# Patient Record
Sex: Female | Born: 1970 | Race: White | Hispanic: No | Marital: Married | Smoking: Never smoker
Health system: Southern US, Community
[De-identification: ages and names within clinical notes are randomized; demographics above are authoritative.]

## PROBLEM LIST (undated history)

## (undated) DIAGNOSIS — F32A Depression, unspecified: Secondary | ICD-10-CM

## (undated) DIAGNOSIS — R569 Unspecified convulsions: Secondary | ICD-10-CM

## (undated) HISTORY — PX: TUBAL LIGATION: SHX77

## (undated) HISTORY — DX: Depression, unspecified: F32.A

## (undated) HISTORY — PX: CHOLECYSTECTOMY: SHX55

## (undated) HISTORY — PX: EYE SURGERY: SHX253

## (undated) HISTORY — PX: ABDOMINAL HYSTERECTOMY: SHX81

## (undated) HISTORY — DX: Unspecified convulsions: R56.9

## (undated) HISTORY — PX: APPENDECTOMY: SHX54

---

## 1998-08-12 ENCOUNTER — Encounter: Payer: Self-pay | Admitting: Emergency Medicine

## 1998-08-12 ENCOUNTER — Emergency Department (HOSPITAL_COMMUNITY): Admission: EM | Admit: 1998-08-12 | Discharge: 1998-08-12 | Payer: Self-pay | Admitting: Emergency Medicine

## 1999-01-04 ENCOUNTER — Other Ambulatory Visit: Admission: RE | Admit: 1999-01-04 | Discharge: 1999-01-04 | Payer: Self-pay | Admitting: Obstetrics & Gynecology

## 1999-04-03 ENCOUNTER — Other Ambulatory Visit: Admission: RE | Admit: 1999-04-03 | Discharge: 1999-04-03 | Payer: Self-pay | Admitting: Obstetrics & Gynecology

## 1999-06-30 ENCOUNTER — Ambulatory Visit (HOSPITAL_COMMUNITY): Admission: RE | Admit: 1999-06-30 | Discharge: 1999-06-30 | Payer: Self-pay

## 1999-07-27 ENCOUNTER — Inpatient Hospital Stay (HOSPITAL_COMMUNITY): Admission: AD | Admit: 1999-07-27 | Discharge: 1999-07-27 | Payer: Self-pay | Admitting: Obstetrics & Gynecology

## 1999-07-28 ENCOUNTER — Encounter (INDEPENDENT_AMBULATORY_CARE_PROVIDER_SITE_OTHER): Payer: Self-pay | Admitting: Specialist

## 1999-07-28 ENCOUNTER — Inpatient Hospital Stay (HOSPITAL_COMMUNITY): Admission: AD | Admit: 1999-07-28 | Discharge: 1999-07-30 | Payer: Self-pay | Admitting: Obstetrics and Gynecology

## 1999-08-30 ENCOUNTER — Other Ambulatory Visit: Admission: RE | Admit: 1999-08-30 | Discharge: 1999-08-30 | Payer: Self-pay | Admitting: Obstetrics & Gynecology

## 2000-12-08 ENCOUNTER — Inpatient Hospital Stay (HOSPITAL_COMMUNITY): Admission: EM | Admit: 2000-12-08 | Discharge: 2000-12-09 | Payer: Self-pay | Admitting: Emergency Medicine

## 2000-12-08 ENCOUNTER — Encounter: Payer: Self-pay | Admitting: Emergency Medicine

## 2000-12-08 ENCOUNTER — Encounter (INDEPENDENT_AMBULATORY_CARE_PROVIDER_SITE_OTHER): Payer: Self-pay | Admitting: Specialist

## 2001-03-20 ENCOUNTER — Other Ambulatory Visit: Admission: RE | Admit: 2001-03-20 | Discharge: 2001-03-20 | Payer: Self-pay | Admitting: Obstetrics & Gynecology

## 2001-04-11 ENCOUNTER — Ambulatory Visit (HOSPITAL_COMMUNITY): Admission: RE | Admit: 2001-04-11 | Discharge: 2001-04-11 | Payer: Self-pay | Admitting: Obstetrics & Gynecology

## 2002-09-10 ENCOUNTER — Encounter: Admission: RE | Admit: 2002-09-10 | Discharge: 2002-09-10 | Payer: Self-pay | Admitting: Family Medicine

## 2002-09-10 ENCOUNTER — Encounter: Payer: Self-pay | Admitting: Family Medicine

## 2003-05-17 ENCOUNTER — Other Ambulatory Visit: Admission: RE | Admit: 2003-05-17 | Discharge: 2003-05-17 | Payer: Self-pay | Admitting: Obstetrics & Gynecology

## 2004-11-15 ENCOUNTER — Other Ambulatory Visit: Admission: RE | Admit: 2004-11-15 | Discharge: 2004-11-15 | Payer: Self-pay | Admitting: Obstetrics & Gynecology

## 2005-05-07 ENCOUNTER — Other Ambulatory Visit: Admission: RE | Admit: 2005-05-07 | Discharge: 2005-05-07 | Payer: Self-pay | Admitting: Obstetrics & Gynecology

## 2006-02-23 ENCOUNTER — Encounter: Admission: RE | Admit: 2006-02-23 | Discharge: 2006-02-23 | Payer: Self-pay | Admitting: Family Medicine

## 2006-04-16 ENCOUNTER — Emergency Department (HOSPITAL_COMMUNITY): Admission: EM | Admit: 2006-04-16 | Discharge: 2006-04-17 | Payer: Self-pay | Admitting: Emergency Medicine

## 2006-04-26 ENCOUNTER — Ambulatory Visit: Payer: Self-pay | Admitting: Gastroenterology

## 2006-04-29 ENCOUNTER — Ambulatory Visit (HOSPITAL_COMMUNITY): Admission: RE | Admit: 2006-04-29 | Discharge: 2006-04-29 | Payer: Self-pay | Admitting: Gastroenterology

## 2006-05-01 ENCOUNTER — Ambulatory Visit: Payer: Self-pay | Admitting: Gastroenterology

## 2006-05-01 ENCOUNTER — Ambulatory Visit (HOSPITAL_COMMUNITY): Admission: RE | Admit: 2006-05-01 | Discharge: 2006-05-01 | Payer: Self-pay | Admitting: Gastroenterology

## 2006-05-06 ENCOUNTER — Ambulatory Visit: Payer: Self-pay | Admitting: Gastroenterology

## 2006-05-10 ENCOUNTER — Ambulatory Visit (HOSPITAL_COMMUNITY): Admission: RE | Admit: 2006-05-10 | Discharge: 2006-05-10 | Payer: Self-pay | Admitting: Gastroenterology

## 2006-05-28 ENCOUNTER — Ambulatory Visit (HOSPITAL_COMMUNITY): Admission: RE | Admit: 2006-05-28 | Discharge: 2006-05-28 | Payer: Self-pay | Admitting: Gastroenterology

## 2006-07-15 ENCOUNTER — Encounter (INDEPENDENT_AMBULATORY_CARE_PROVIDER_SITE_OTHER): Payer: Self-pay | Admitting: Specialist

## 2006-07-15 ENCOUNTER — Ambulatory Visit (HOSPITAL_COMMUNITY): Admission: RE | Admit: 2006-07-15 | Discharge: 2006-07-16 | Payer: Self-pay | Admitting: Surgery

## 2008-04-20 ENCOUNTER — Encounter: Admission: RE | Admit: 2008-04-20 | Discharge: 2008-04-20 | Payer: Self-pay | Admitting: Family Medicine

## 2008-07-27 IMAGING — CT CT PELVIS W/ CM
2 of 5 series · 17 of 46 positions shown, 19 images · IV contrast (omnipaque)
Comparison: Report of a prior study in November 2001.  The actual images have been purged and are no longer available for comparison.

CLINICAL DATA: Right upper quadrant pain with nausea and vomiting.  
 ABDOMEN CT WITH CONTRAST:
TECHNIQUE: Multidetector CT imaging of the abdomen was performed following the standard protocol during bolus administration of intravenous contrast.
 Contrast:  100 cc Omnipaque 300 and oral contrast.
TECHNIQUE: Multidetector CT imaging of the pelvis was performed following the standard protocol during bolus administration of intravenous contrast.

[Series 2: abd/pelv with 5.0 b31f st · axial · 0.68mm/px · z∈[-468,-63]mm · 14 of 91 slices shown, 16 images]
[im 5/91  soft-tissue]
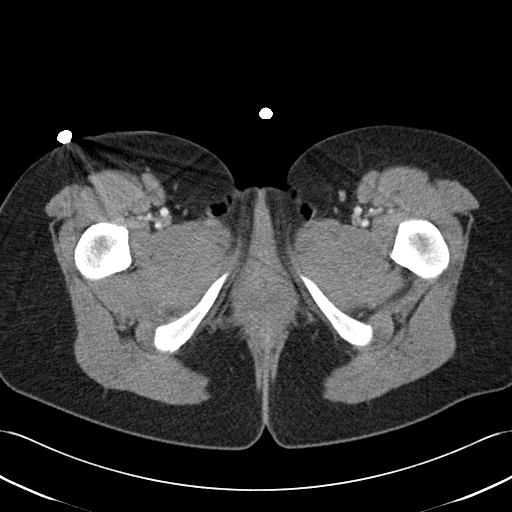
[im 5/91  bone]
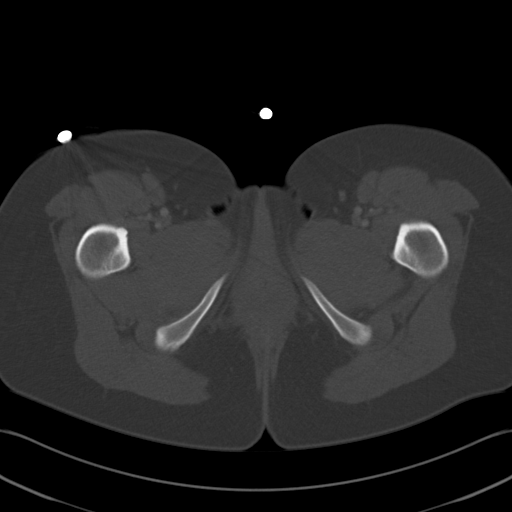
[im 14/91  soft-tissue]
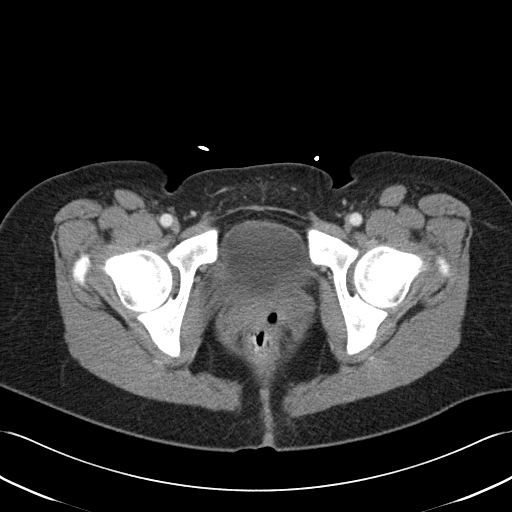
[im 19/91  soft-tissue]
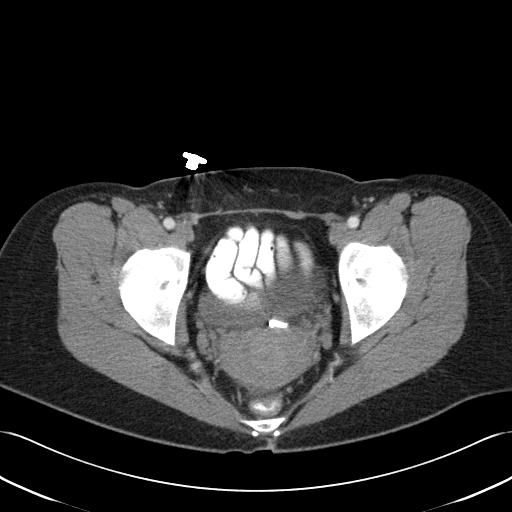
[im 23/91  soft-tissue]
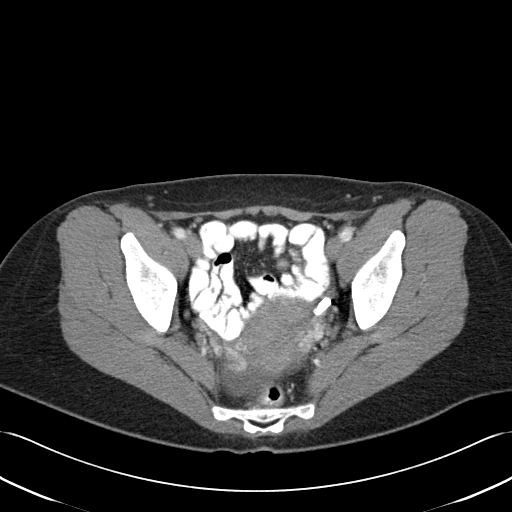
[im 32/91  soft-tissue]
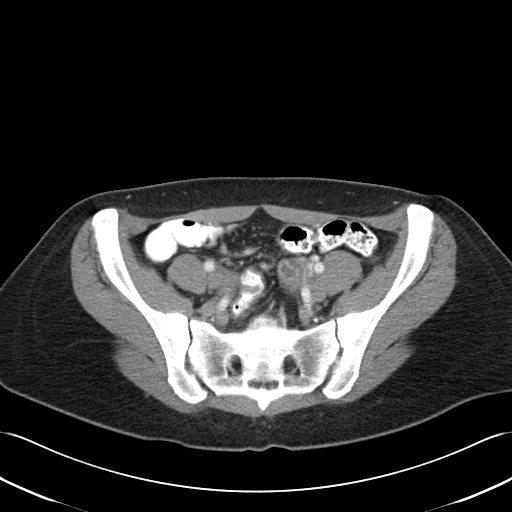
[im 37/91  soft-tissue]
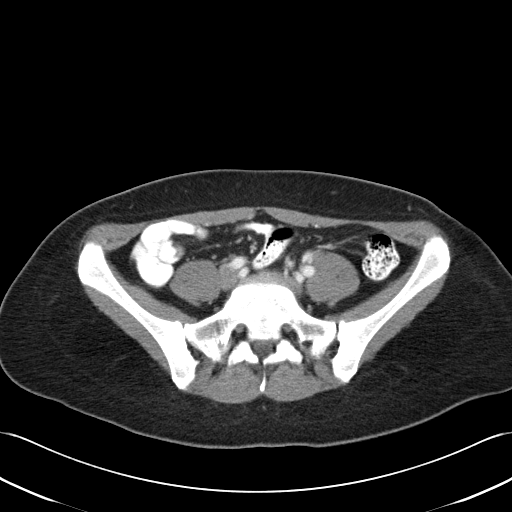
[im 41/91  soft-tissue]
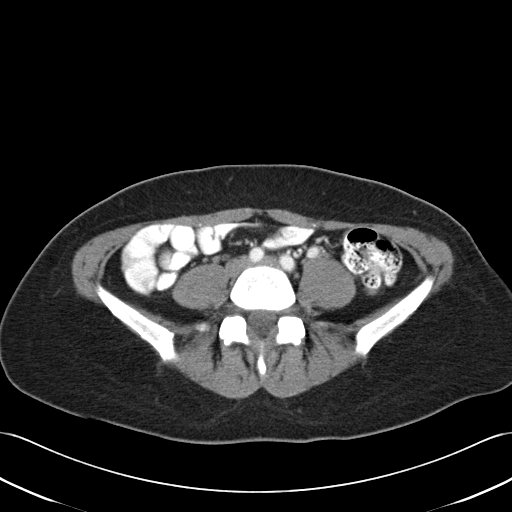
[im 50/91  soft-tissue]
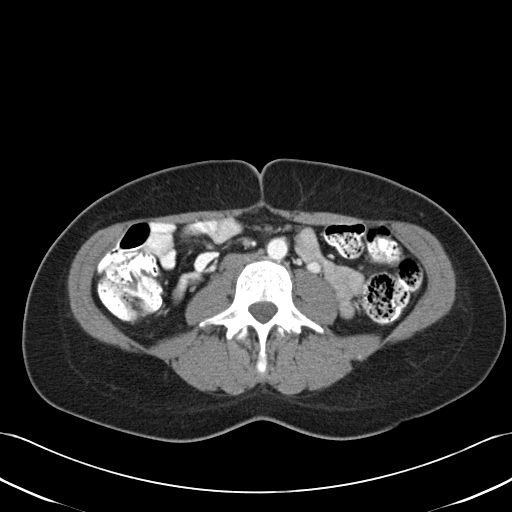
[im 55/91  soft-tissue]
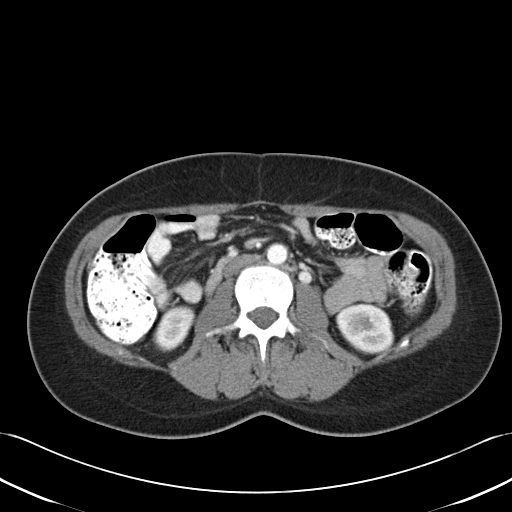
[im 55/91  bone]
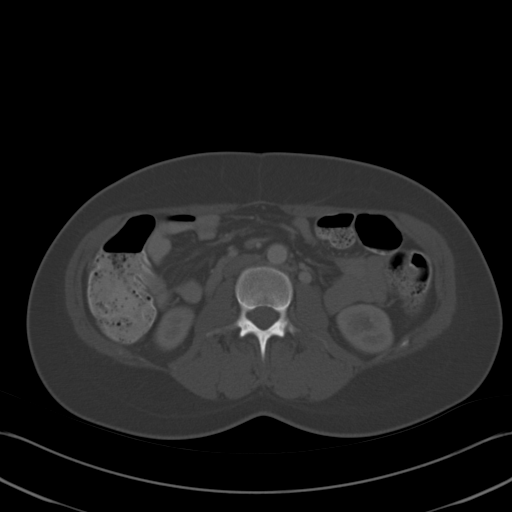
[im 59/91  soft-tissue]
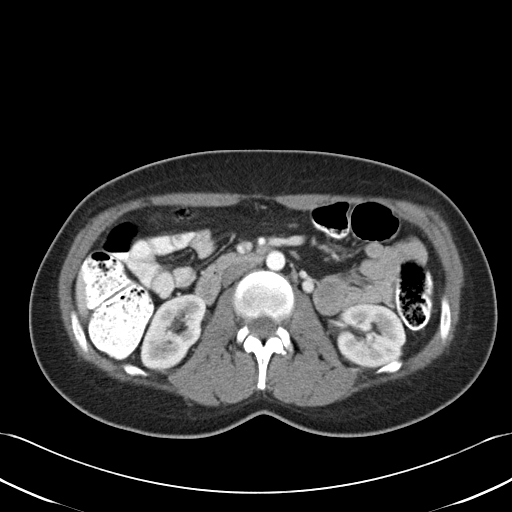
[im 68/91  soft-tissue]
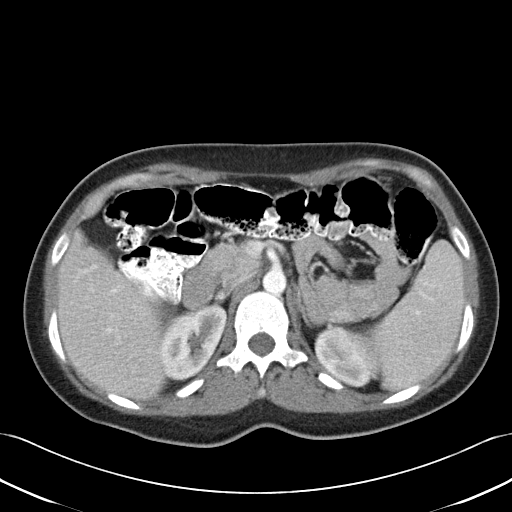
[im 73/91  soft-tissue]
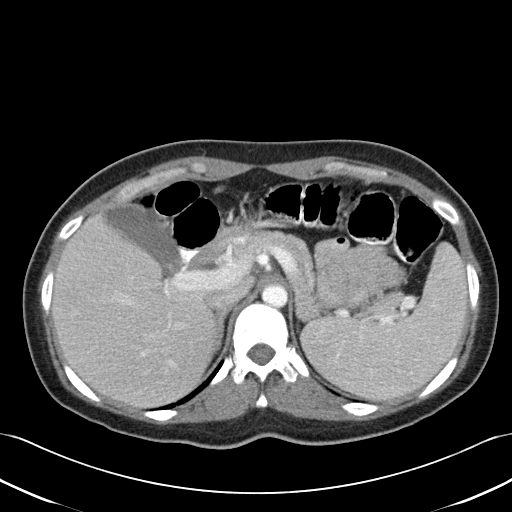
[im 77/91  soft-tissue]
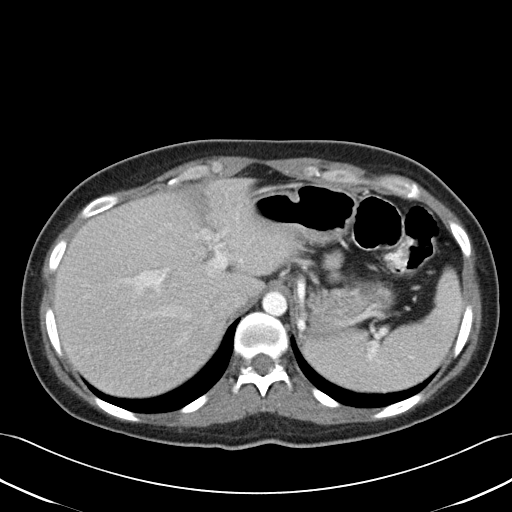
[im 86/91  soft-tissue]
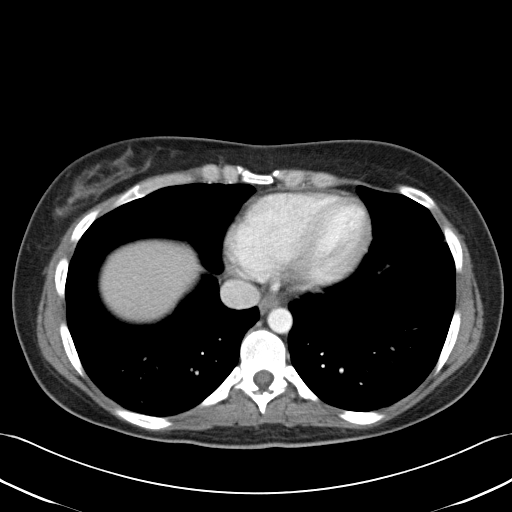

[Series 603: coronals · coronal · 0.89mm/px · 3 of 97 slices shown]
[im 33/97  soft-tissue]
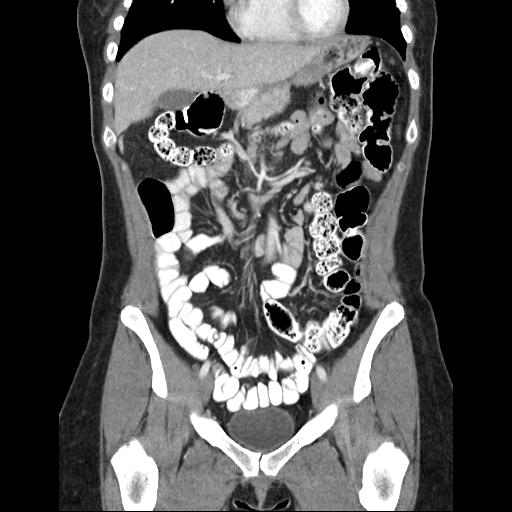
[im 43/97  soft-tissue]
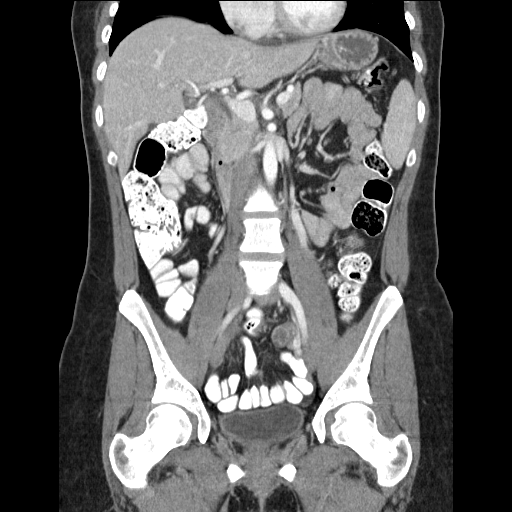
[im 54/97  soft-tissue]
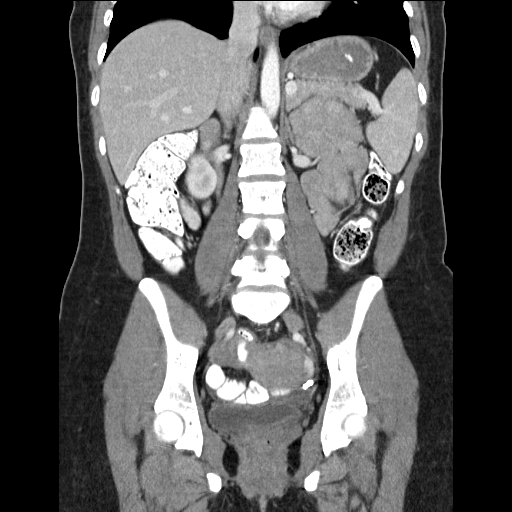

[17 of 46 positions shown; findings below may reference images not displayed]

FINDINGS: Lung bases are clear.  Liver, spleen, pancreas, and adrenal glands are normal.  No calcified gallstones or bile duct dilatation.  Early and delayed images of the kidneys show no masses, inflammatory changes, or obstruction.  Visualized bowel is normal.
IMPRESSION: No pathological findings.  
 PELVIS CT WITH CONTRAST:
FINDINGS: No focal masses, adenopathy, or fluid collections.  Note is made of prominent gonadal veins bilaterally, especially on the left, along with prominent venous structures around each ovary.  The findings are analogous to varicocele in a male.  This can be a cause of chronic pelvic pain, but not classically right upper quadrant pain.  This needs careful clinical correlation.  
 There is a small amount of fluid in the central and right cul-de-sac on images 69 thru 71.  
 No evidence for inflammatory bowel disease.  The appendix is not visualized, consistent with a history of appendectomy.  Also noted are metallic surgical clips in the pelvis centrally and on the left, most likely related to prior tubal ligation.
IMPRESSION: 1.  No acute abnormality.
 2.  Findings consistent with pelvic venous congestion.  See report.

## 2008-10-10 IMAGING — RF DG CHOLANGIOGRAM OPERATIVE
1 series · 4 of 4 positions shown · non-contrast
Comparison: none

CLINICAL DATA: Cholecystectomy for biliary dyskinesia.  
 INTRAOPERATIVE CHOLANGIOGRAM:

[Series 1: run · 4 of 106 frames shown]
[frame 16/106]
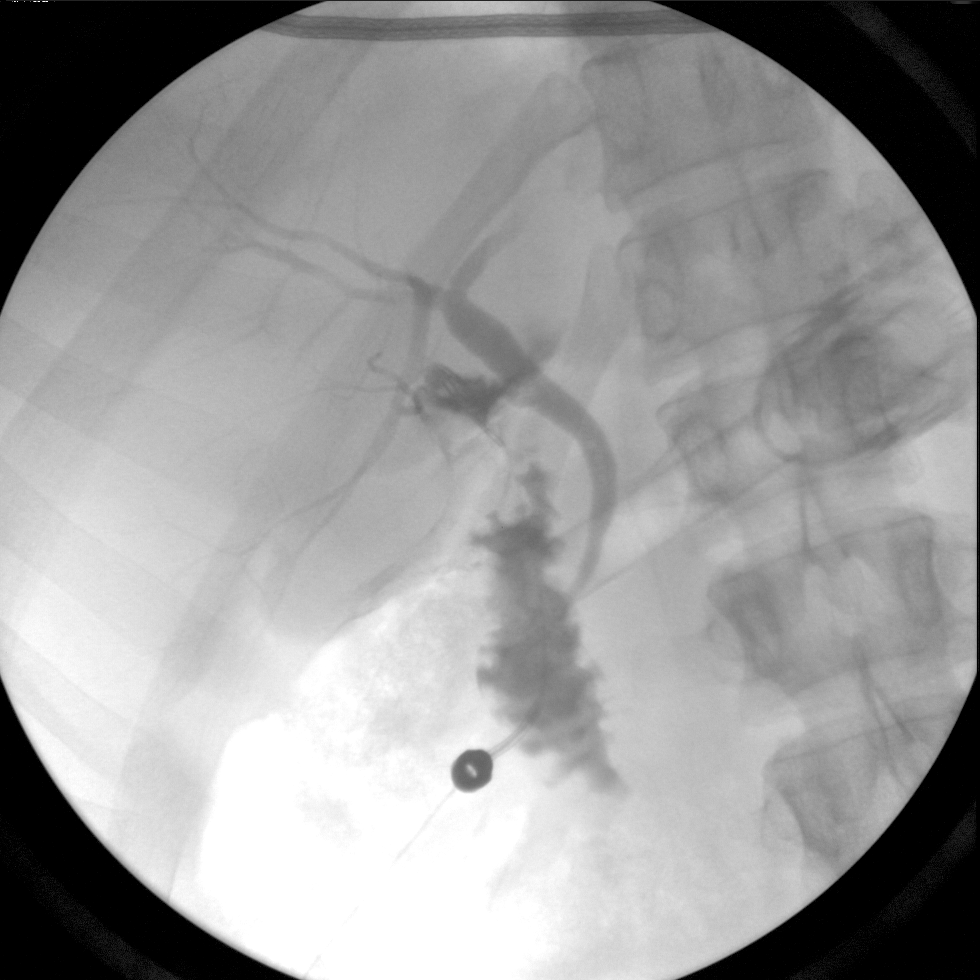
[frame 54/106]
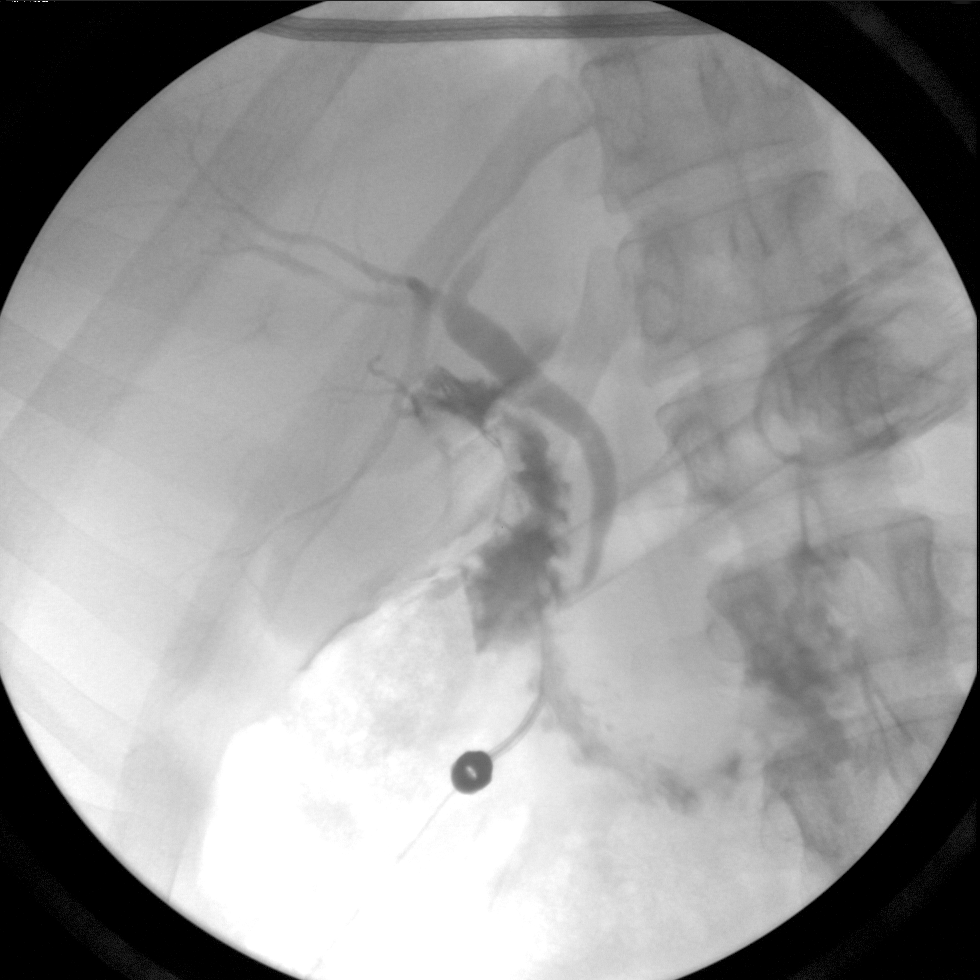
[frame 91/106]
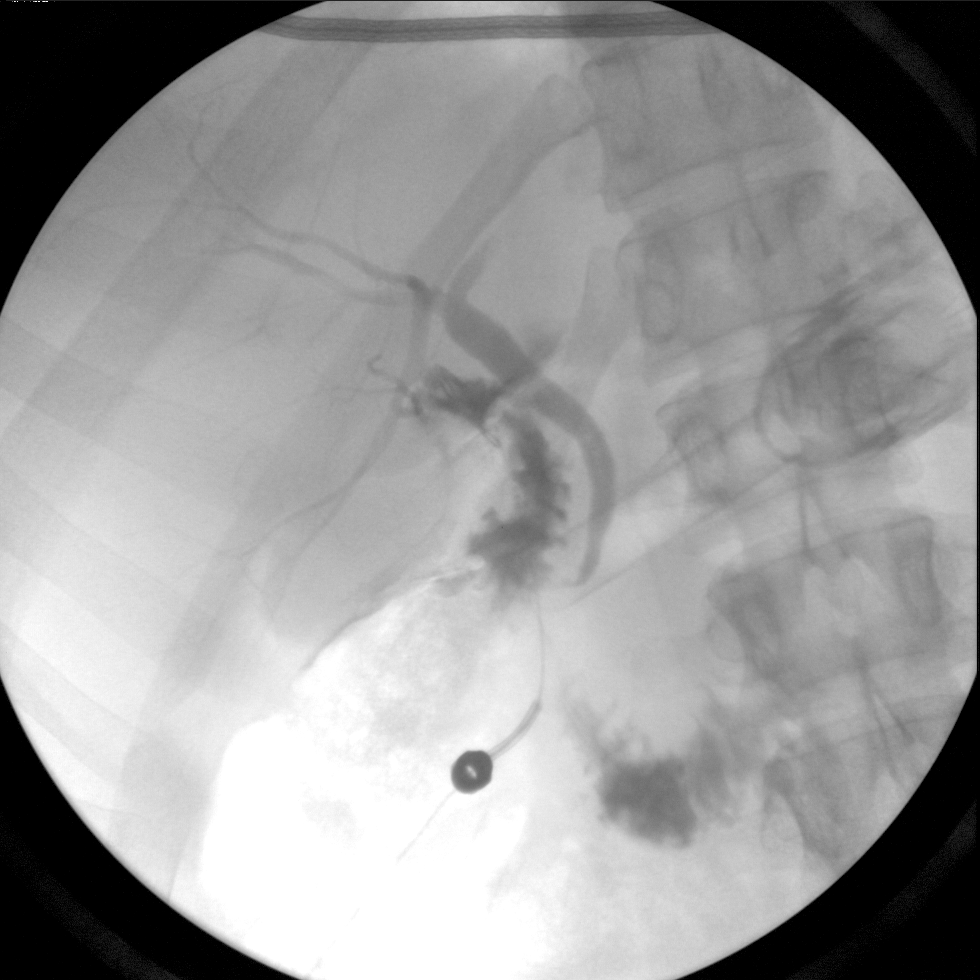
[frame 106/106]
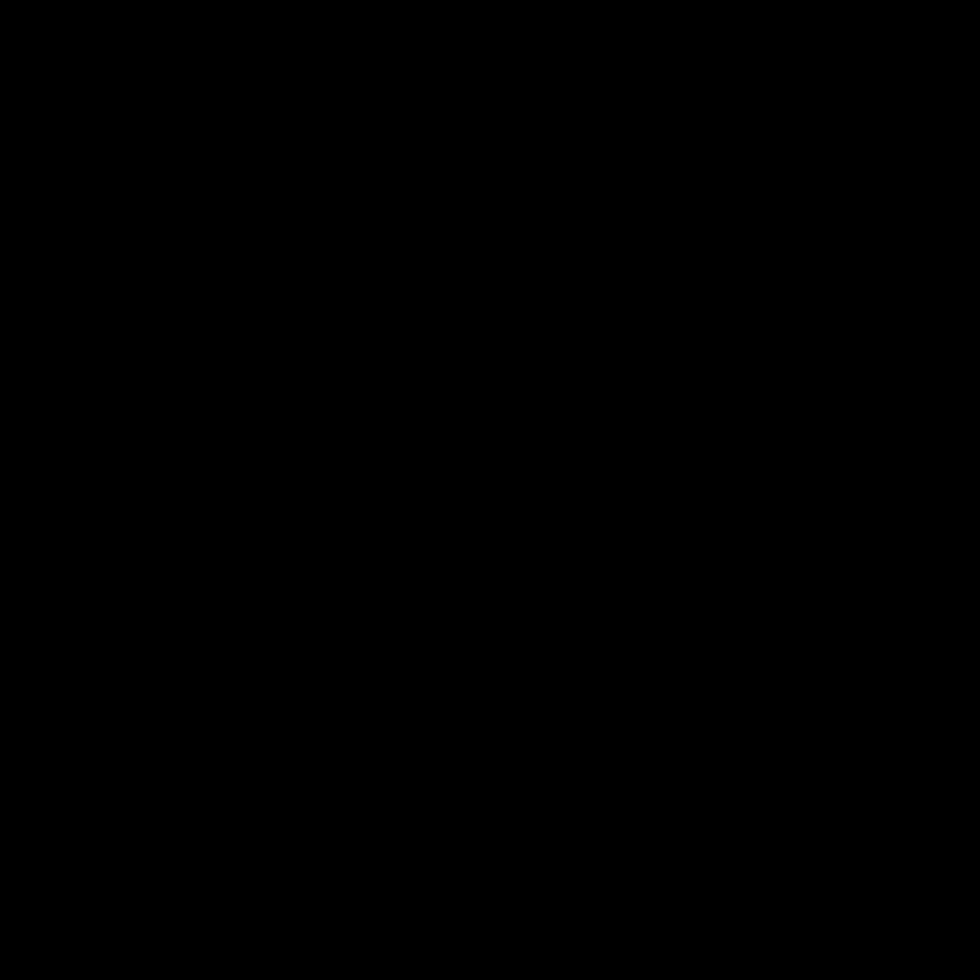

[4 of 4 positions shown; findings below may reference images not displayed]

FINDINGS: Imaging obtained with a C-arm intraoperatively shows injection of contrast material into the biliary tree via a ligated cystic duct.  Some air bubbles are introduced into the duct during injection and clearly transit through the common bile duct into the duodenum.  No evidence of abnormal filling defect or biliary dilatation.
IMPRESSION: Unremarkable intraoperative cholangiogram.

## 2009-08-20 HISTORY — PX: LAPAROSCOPIC TOTAL HYSTERECTOMY: SUR800

## 2009-10-31 ENCOUNTER — Emergency Department (HOSPITAL_COMMUNITY): Admission: EM | Admit: 2009-10-31 | Discharge: 2009-10-31 | Payer: Self-pay | Admitting: Emergency Medicine

## 2010-06-20 ENCOUNTER — Encounter (HOSPITAL_COMMUNITY): Payer: Self-pay | Admitting: Obstetrics and Gynecology

## 2010-06-20 ENCOUNTER — Ambulatory Visit (HOSPITAL_COMMUNITY): Admission: RE | Admit: 2010-06-20 | Discharge: 2010-06-21 | Payer: Self-pay | Admitting: Obstetrics and Gynecology

## 2010-07-17 IMAGING — US US PELVIS COMPLETE
1 series · 14 of 25 positions shown · non-contrast
Comparison: none

CLINICAL DATA: Epigastric and upper back pain.  History pelvic
venous congestion on CT.

TRANSABDOMINAL AND TRANSVAGINAL ULTRASOUND OF PELVIS
TECHNIQUE: Both transabdominal and transvaginal ultrasound
examinations of the pelvis were performed including evaluation of
the uterus, ovaries, adnexal regions, and pelvic cul-de-sac.
[REDACTED] abdominal ultrasound 04/20/2008 and [REDACTED] pelvic ultrasound
04/16/2006.

[Series 1: us pelvis complete · 0.24mm/px · 14 of 65 slices shown]
[im 1/65]
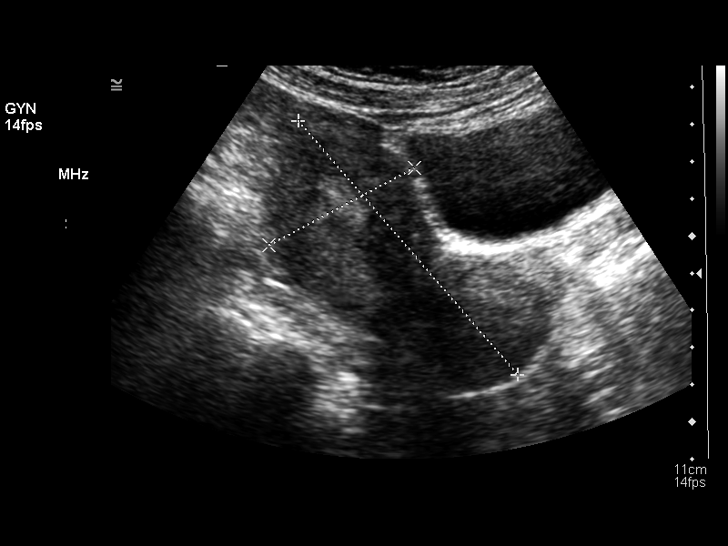
[im 6/65]
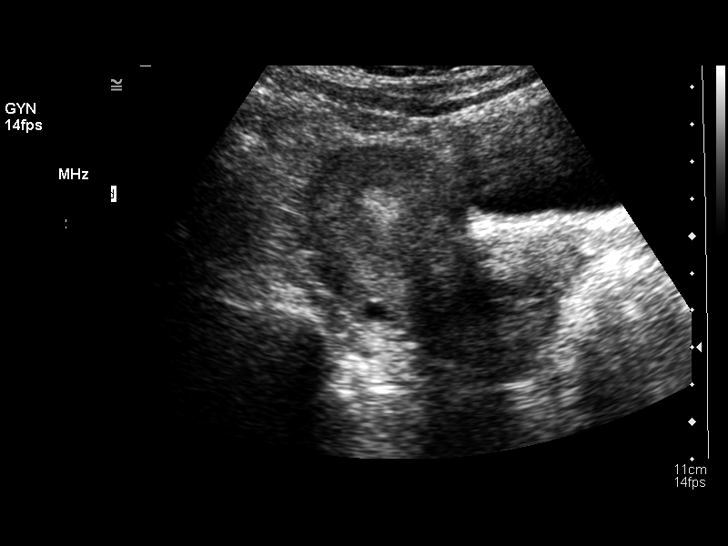
[im 11/65]
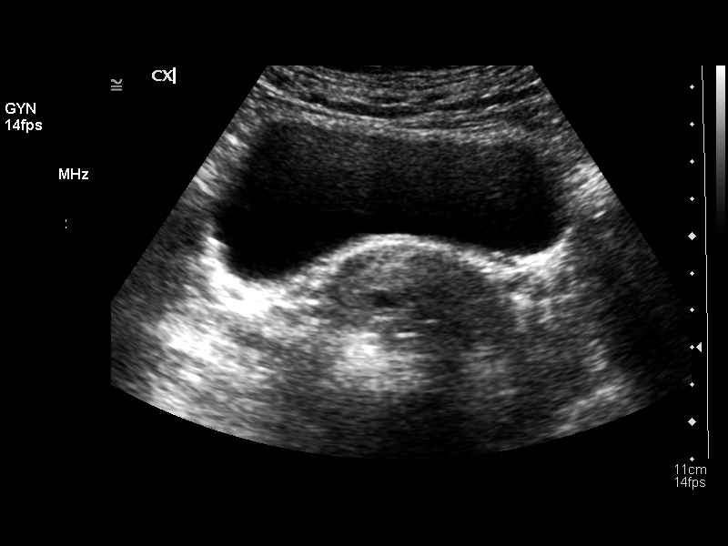
[im 17/65]
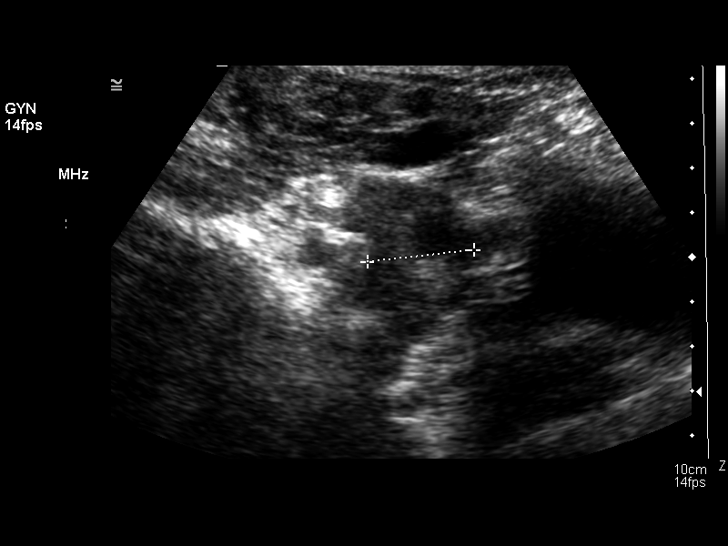
[im 22/65]
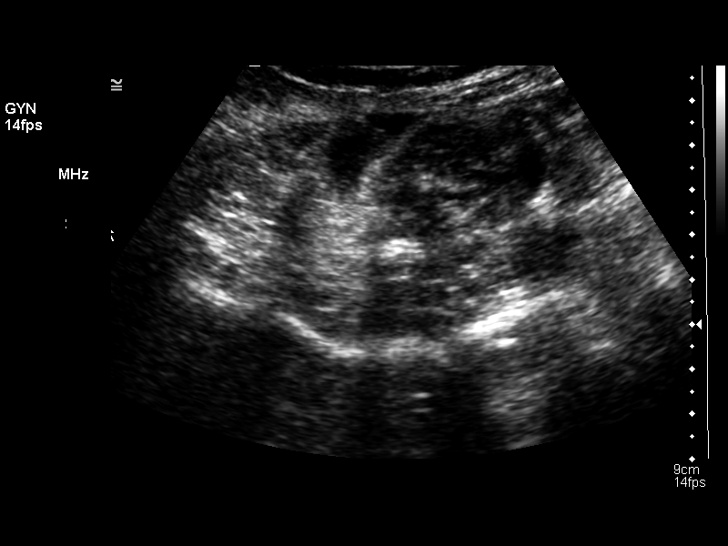
[im 25/65]
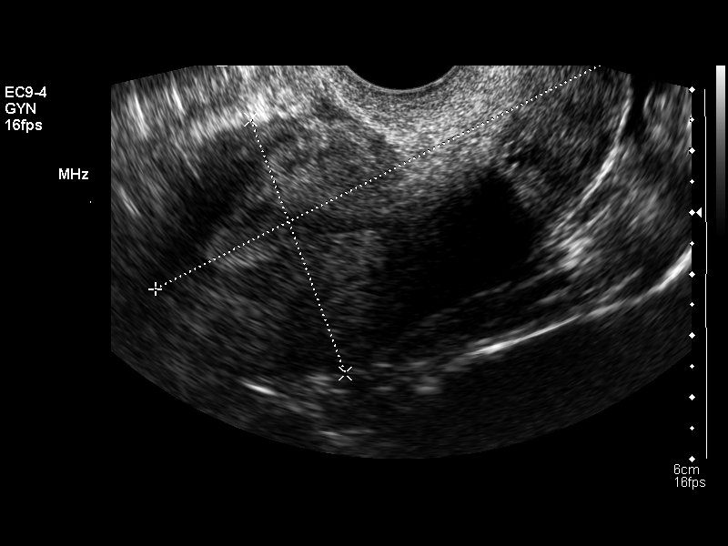
[im 30/65]
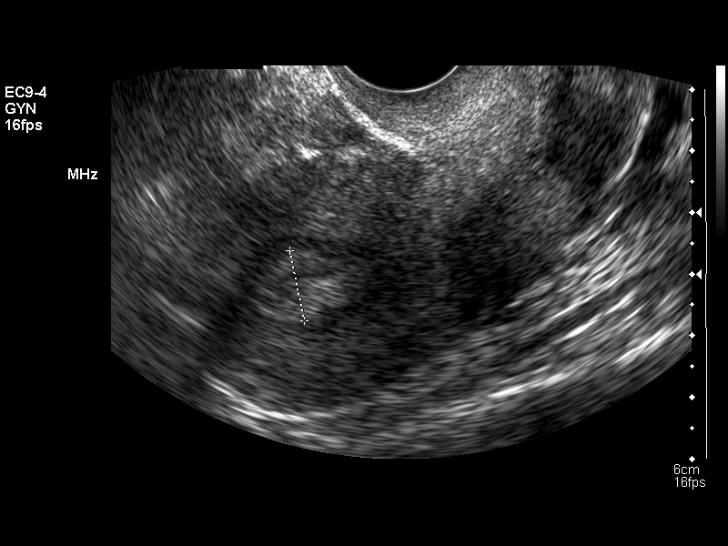
[im 35/65]
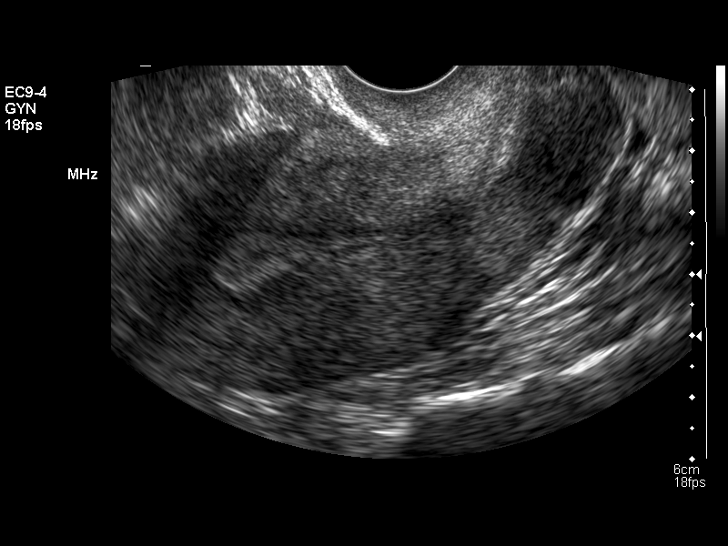
[im 41/65]
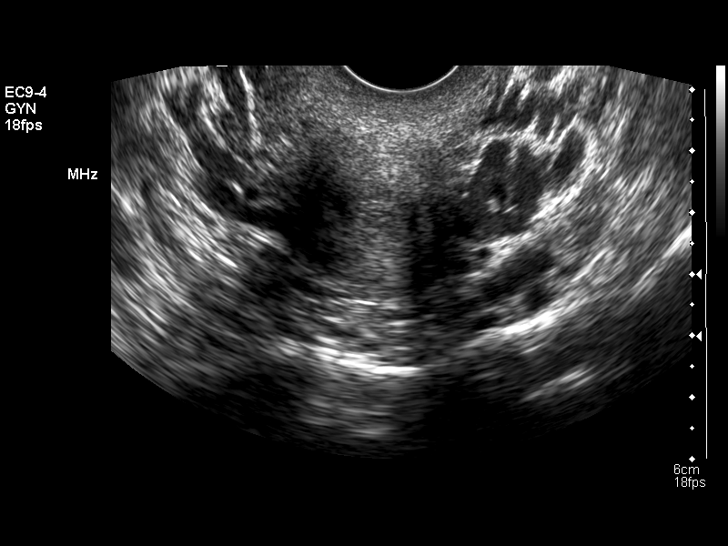
[im 43/65]
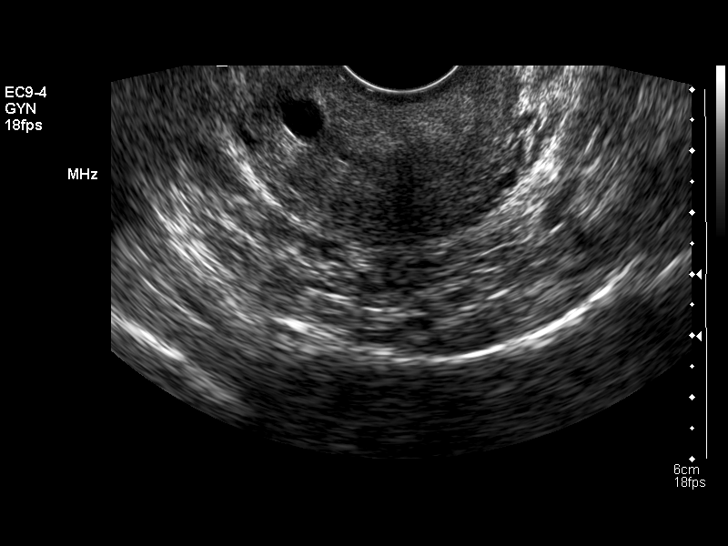
[im 49/65]
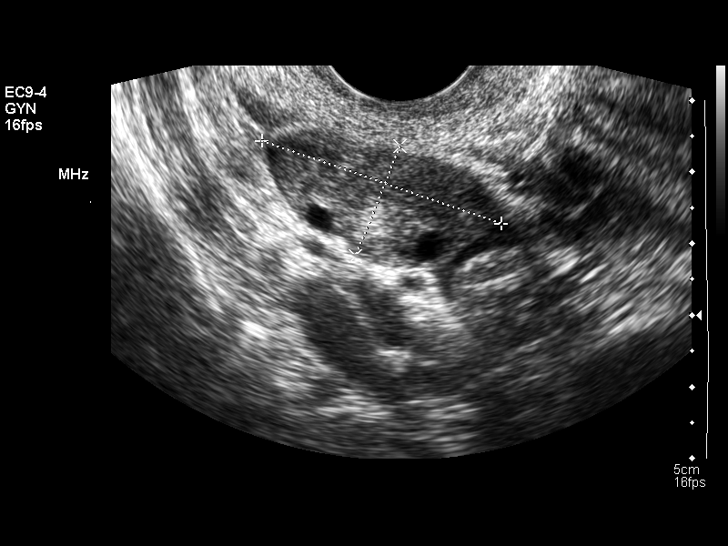
[im 54/65]
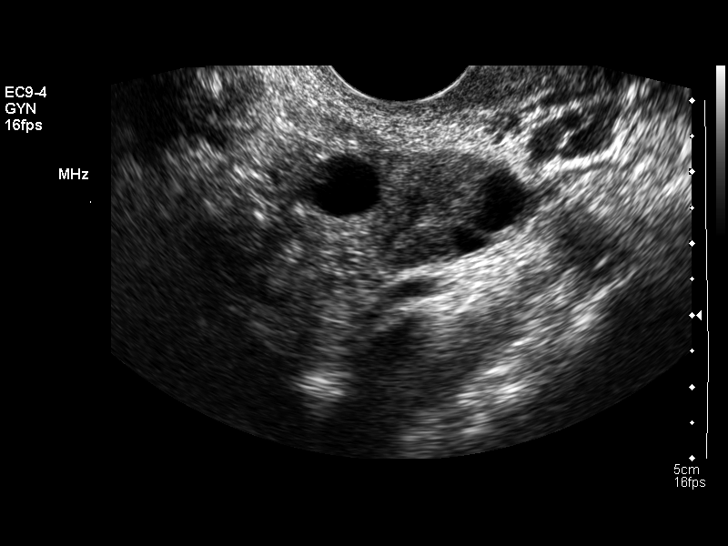
[im 59/65]
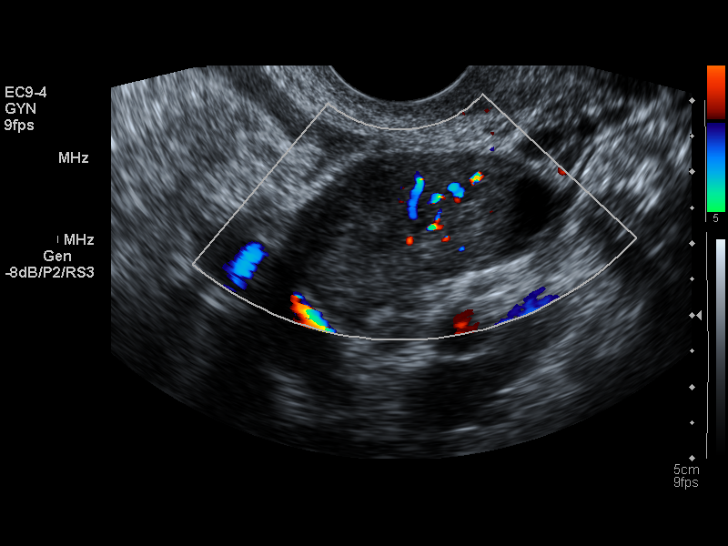
[im 65/65]
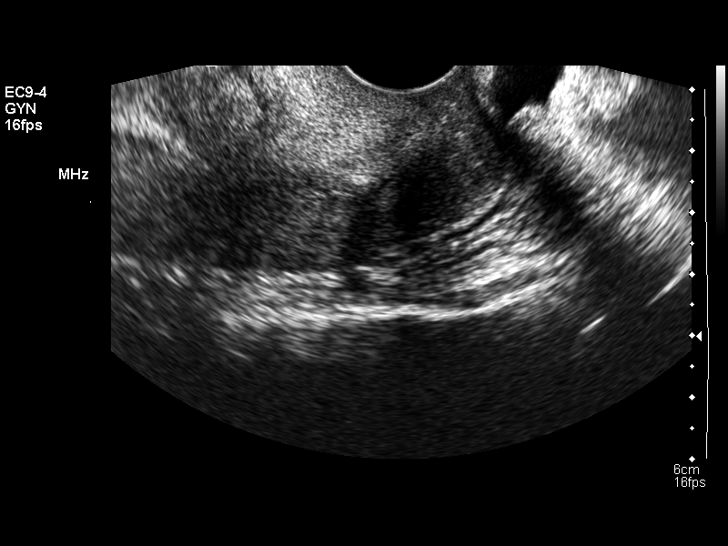

[14 of 25 positions shown; findings below may reference images not displayed]

FINDINGS: The uterus is within normal limits in size and appearance
measuring 8.4 cm long X 4.4 cm AP X 5.4 cm wide.  No uterine masses
are identified.  The endometrium is within normal limits in
thickness measuring 12 mm.

Right ovary is normal in appearance, with no masses or abnormal
cystic lesions are seen. Right ovary measures 4.4 cm long X 2.0 cm
AP X 3.5 cm wide.  Left ovary is not visualized.  No other adnexal
abnormalities are identified. Slight cul-de-sac free fluid seen
transvaginally only is likely physiological.
IMPRESSION: 1.  Nonvisualization left ovary.
2.  Probable physiologic slight cul-de-sac free fluid seen
transvaginally only.
3.  Otherwise, no significant abnormality.

## 2010-07-17 IMAGING — US US ABDOMEN COMPLETE
1 series · 14 of 25 positions shown · non-contrast
Comparison: [REDACTED] abdominal pelvic ultrasound 04/16/2006, abdominal
pelvic CT 05/01/2006, [REDACTED] nuclear medicine hepatic biliary scan
05/28/2006 and [REDACTED] intraoperative cholangiogram 07/15/2006.

CLINICAL DATA: Epigastric and upper back pain. Post cholecystectomy
and appendectomy.

ABDOMEN ULTRASOUND
TECHNIQUE: Complete abdominal ultrasound examination was performed
including evaluation of the liver, bile ducts, pancreas, kidneys,
spleen, IVC, and abdominal aorta.

[Series 1: us abdomen complete · 0.29mm/px · 14 of 75 slices shown]
[im 1/75]
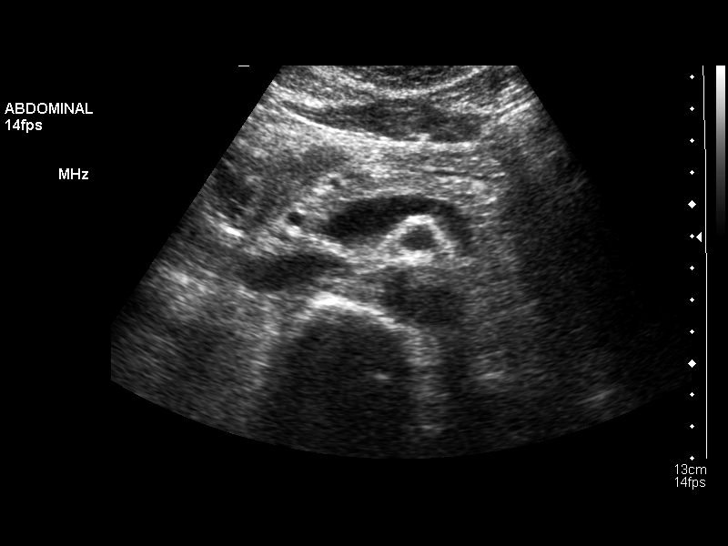
[im 7/75]
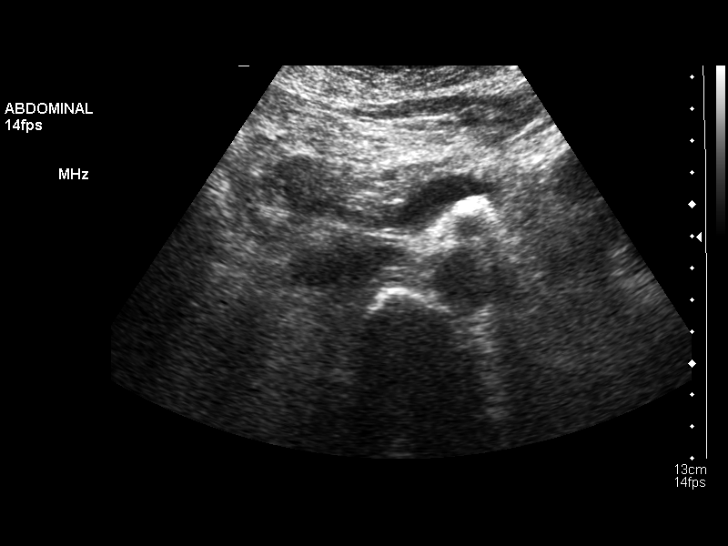
[im 13/75]
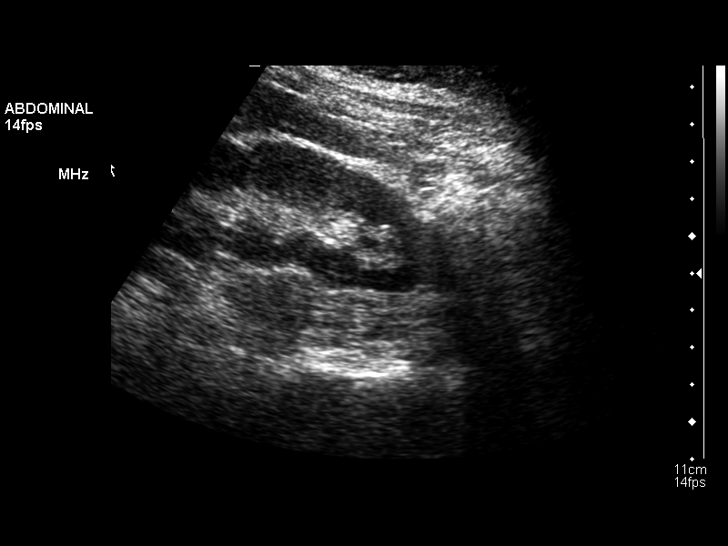
[im 19/75]
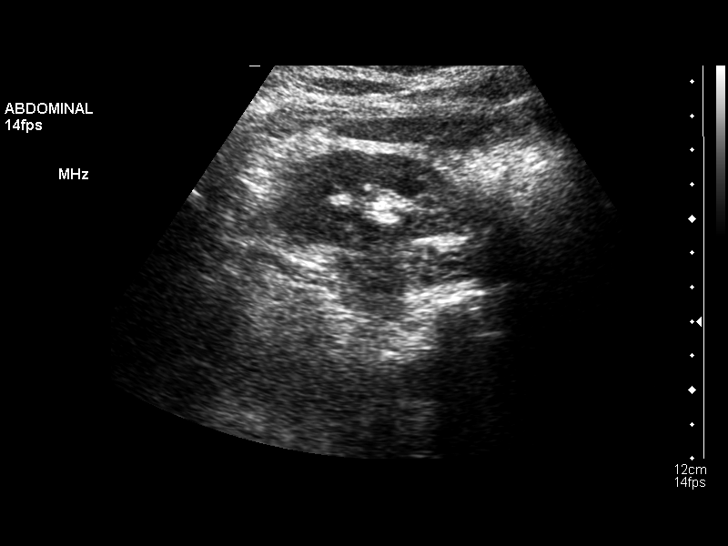
[im 25/75]
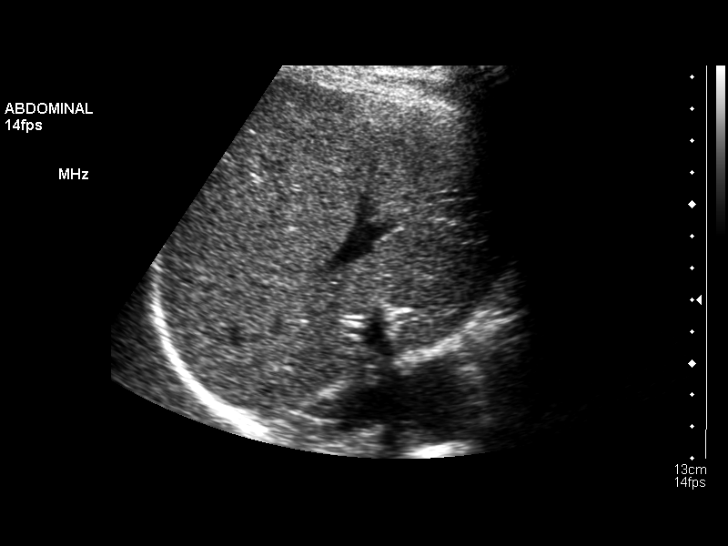
[im 28/75]
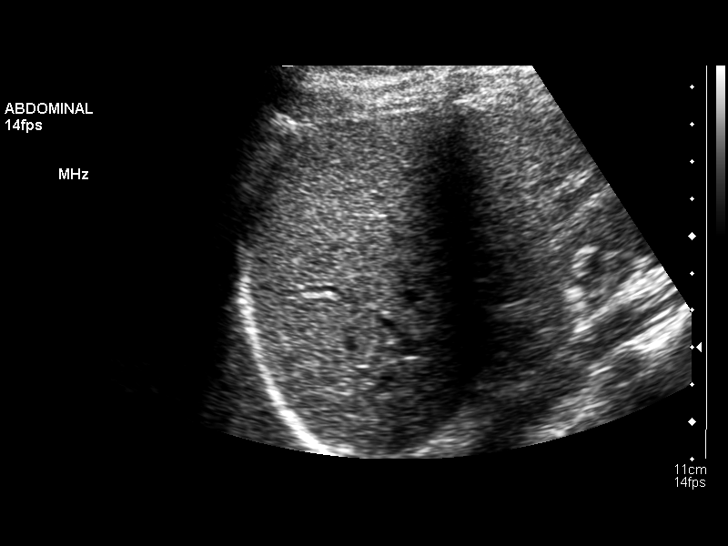
[im 34/75]
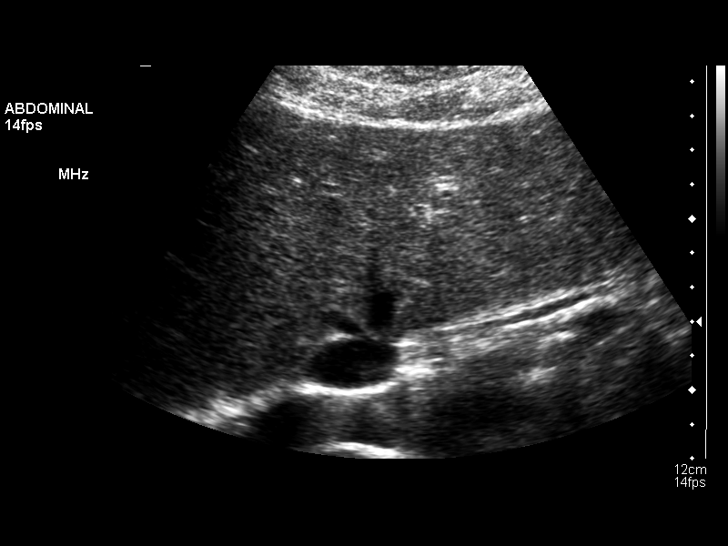
[im 41/75]
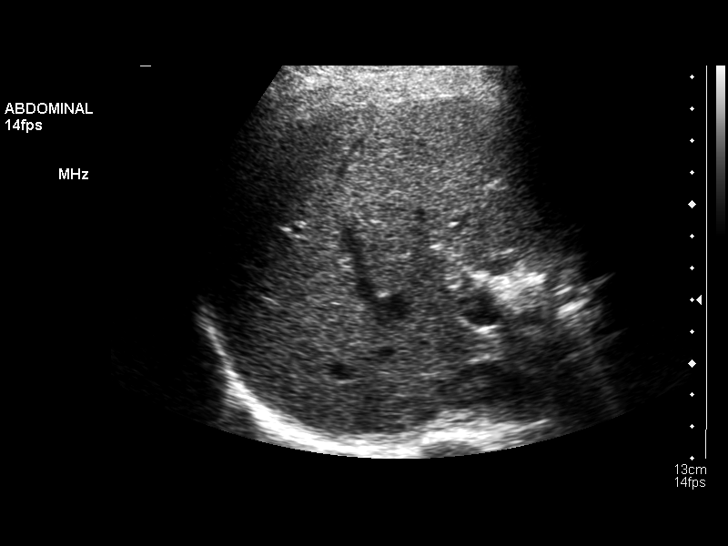
[im 47/75]
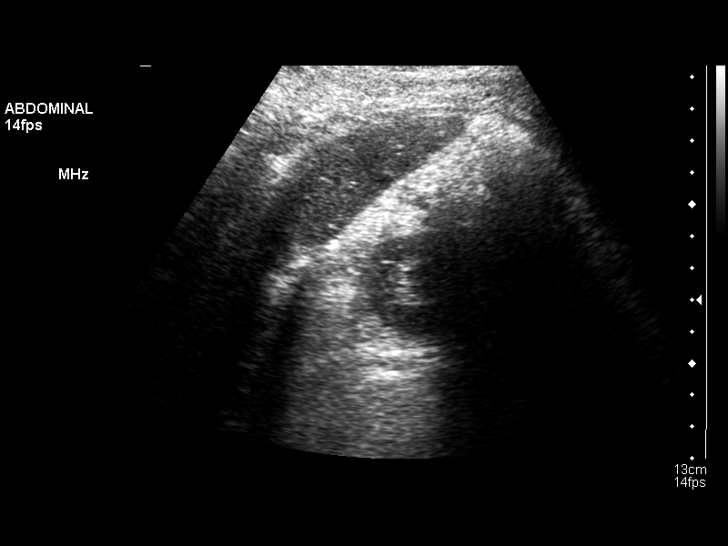
[im 50/75]
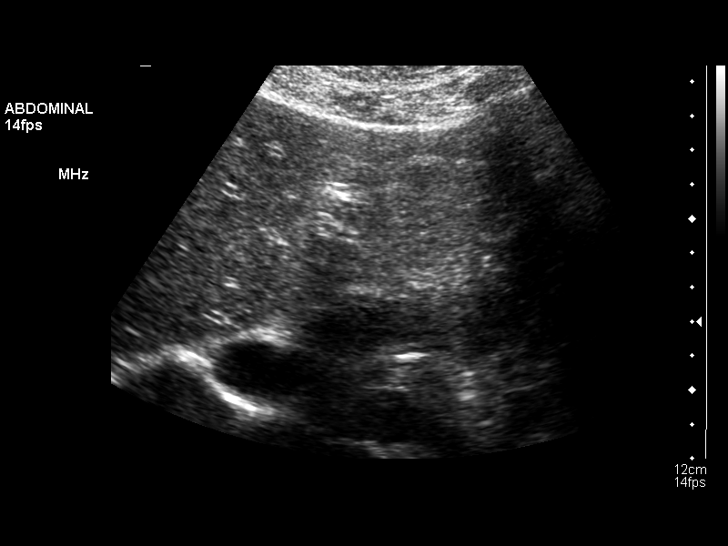
[im 56/75]
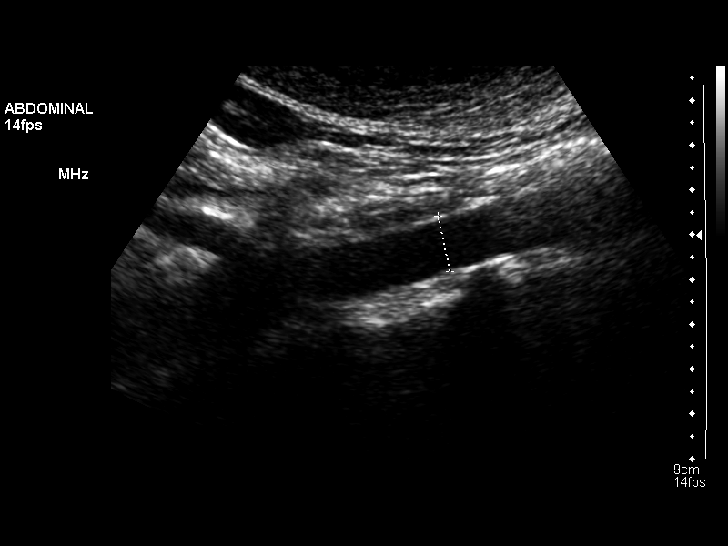
[im 62/75]
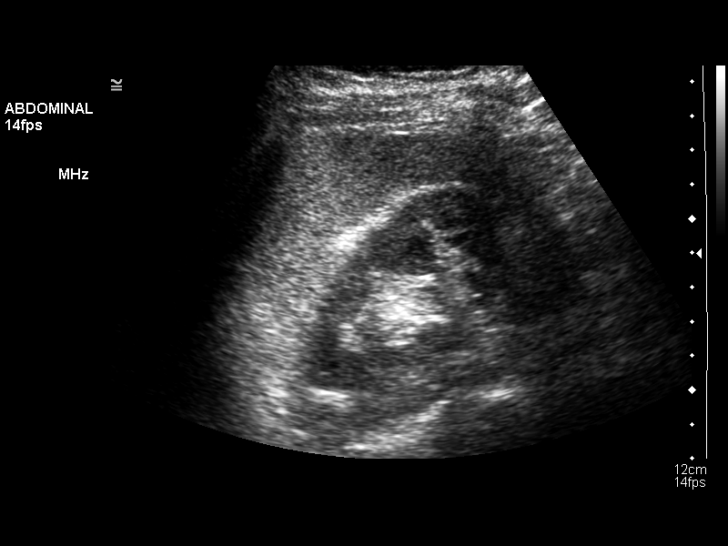
[im 68/75]
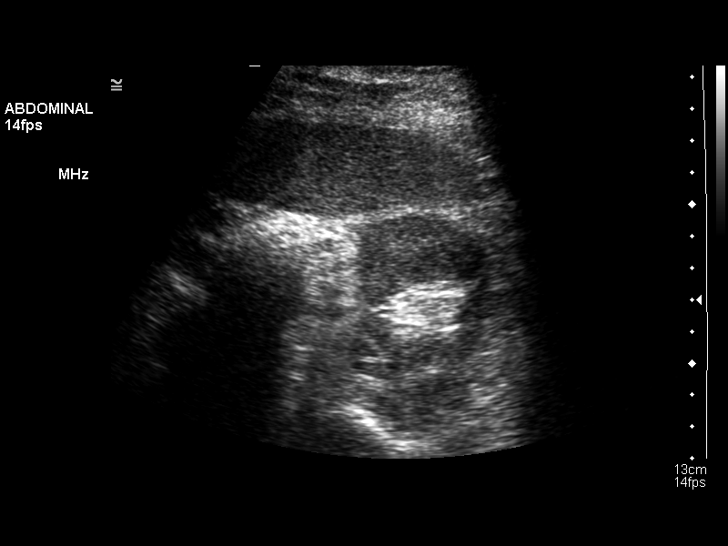
[im 75/75]
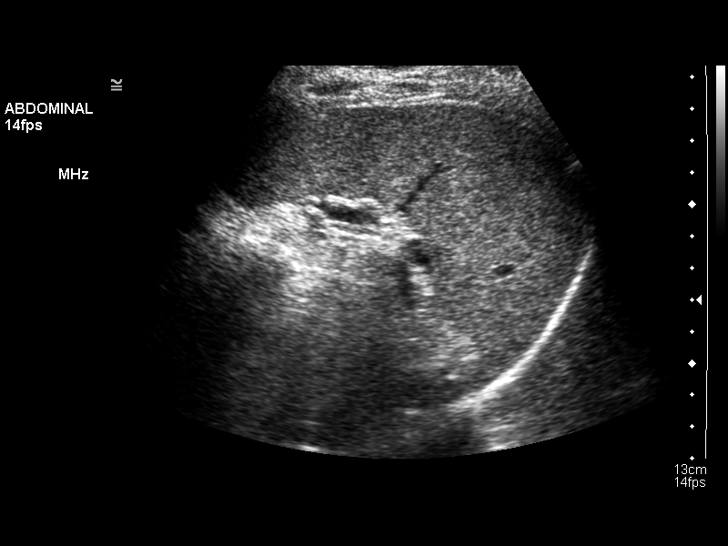

[14 of 25 positions shown; findings below may reference images not displayed]

FINDINGS: Gallbladder is surgically removed.  No dilated
intrahepatic or extrahepatic bile ducts are seen with common bile
duct measuring normally at 4 mm.  Visualized liver, inferior vena
cava, pancreas, spleen (12.4 cm long), right kidney (10.1 cm long),
left kidney (10.7 cm long), and abdominal aorta (maximum diameter
1.5 cm) appear sonographically normal.  No free fluid noted.
IMPRESSION: 1.  Post cholecystectomy.
2.  Otherwise, normal.

## 2010-10-31 LAB — TYPE AND SCREEN: Antibody Screen: NEGATIVE

## 2010-10-31 LAB — CBC
HCT: 34.6 % — ABNORMAL LOW (ref 36.0–46.0)
Hemoglobin: 11.9 g/dL — ABNORMAL LOW (ref 12.0–15.0)
MCH: 30.8 pg (ref 26.0–34.0)
MCV: 89.2 fL (ref 78.0–100.0)
RBC: 3.88 MIL/uL (ref 3.87–5.11)
RDW: 12.1 % (ref 11.5–15.5)
WBC: 12.1 10*3/uL — ABNORMAL HIGH (ref 4.0–10.5)

## 2010-11-01 LAB — SURGICAL PCR SCREEN
MRSA, PCR: NEGATIVE
Staphylococcus aureus: NEGATIVE

## 2010-11-01 LAB — CBC
HCT: 41.7 % (ref 36.0–46.0)
MCHC: 34.5 g/dL (ref 30.0–36.0)
RBC: 4.74 MIL/uL (ref 3.87–5.11)
RDW: 12.4 % (ref 11.5–15.5)
WBC: 6.1 10*3/uL (ref 4.0–10.5)

## 2011-01-05 NOTE — Letter (Signed)
April 26, 2006     Burnell Blanks, MD  [address] [place]  [address]   RE:  [name], [name]  MRN:  [id number]  /  DOB:  1971/01/17   Dear Dr. Nathanial Rancher,   Upon your kind referral, I had the pleasure of evaluating your patient and I  am pleased to offer my findings. I saw Jaime Richardson in the office today.  Enclosed is a copy of my progress note that details my findings and  recommendations.   Thank you for the opportunity to participate in your patient's care.    Sincerely,      Barbette Hair. Arlyce Dice, MD,FACG   RDK/MedQ  DD:  04/26/2006  DT:  04/26/2006  Job #:  [id number]

## 2011-01-05 NOTE — Letter (Signed)
April 26, 2006    Ms. Nurse, learning disability  [address]  [place], [place]  [address]   RE:  MCKENZY, SALAZAR  MRN:  [id number]  /  DOB:  June 21, 1971   Dear Ms. Engineer, production,   It is my pleasure to have treated you recently as a new patient in my  office. I appreciate your confidence and the opportunity to participate in  your care.   Since I do have a busy inpatient endoscopy schedule and office schedule, my  office hours vary weekly. I am, however, available for emergency calls every  day through my office. If I cannot promptly meet an urgent office  appointment, another one of our gastroenterologists will be able to assist  you.   My well-trained staff are prepared to help you at all times. For emergencies  after office hours, a physician from our gastroenterology section is always  available through my 24-hour answering service.   While you are under my care, I encourage discussion of your questions and  concerns, and I will be happy to return your calls as soon as I am  available.   Once again, I welcome you as a new patient and I look forward to a happy and  healthy relationship.   Sincerely,     Barbette Hair. Arlyce Dice, MD,FACG   RDK/MedQ  DD:  04/26/2006 DT:  04/26/2006 Job #:  [id number]

## 2011-01-05 NOTE — Op Note (Signed)
[place]  Patient:    Jaime Richardson, Jaime Richardson                    MRN: [id number] Proc. Date: 12/08/00 Adm. Date:  [id number] Attending:  Andre Lefort                           Operative Report  DATE OF BIRTH:  [birth year]  PREOPERATIVE DIAGNOSIS:  Acute appendicitis.  POSTOPERATIVE DIAGNOSIS:  Acute retrocecal appendicitis.  OPERATION PERFORMED:  Laparoscopic appendectomy.  SURGEON:  Sandria Bales. Ezzard Standing, M.D.  ASSISTANT:  None.  ANESTHESIA:  General endotracheal.  ESTIMATED BLOOD LOSS:  Minimal.  INDICATIONS FOR PROCEDURE:  The patient is a 40 year old white female who has presented with signs and symptoms of acute appendicitis and has a CT scan also suggesting appendicitis.  The patient now comes for attempted laparoscopic appendectomy.  DESCRIPTION OF PROCEDURE:  The patient was placed in supine position with both her arms tucked.  She was given 1 gm of Cefotetan at initiation of the procedure.  She had Foley catheter in place, PAS stockings in place.  Her abdomen was prepped with Betadine solution and sterilely draped.  An infraumbilical incision was made with sharp dissection carried down to the abdominal cavity.  A 0 degree laparoscope was passed through a 12 mm Hasson trocar and the Hasson trocar secured with a 0 Vicryl suture.  The laparoscopic exploration revealed right and left lobes of the liver were unremarkable. Gallbladder was unremarkable.  Anterior wall of the stomach unremarkable.  The bowel I could see pretty well and was unremarkable.  It then placed a 5 mm Ethicon trocar in the right subcostal location and a 10 mm Ethicon trocar in the left lower quadrant location.  I was able to roll the cecum over and the patient had a retrocecal appendicitis, had limited contamination. I was able to mobilize the appendix using Harmonic scalpel.  I then divided the base of the appendix with an endoscopic GIA stapler.  The  vascular 2.5 stapler.  I then delivered the appendix in an Endo catch bag and delivered it through the umbilicus.  I then revisualized the cecum.  The staple line looked clean.  I irrigated out the abdomen and pelvis.  I then removed the trocars under direct visualization.  The umbilical trocar was closed with a 0 Vicryl suture.  The skin closed with a 5-0 Vicryl suture and painted with tincture of benzoin, steri-stripped and sterilely dressed.  The patient tolerated the procedure well and was transported to the recovery room in good condition.  The sponge and needle counts were correct at the end of the case. DD:  12/08/00 TD:  12/09/00 Job: [id number]

## 2011-01-05 NOTE — H&P (Signed)
[place]  Patient:    Jaime Richardson, Jaime Richardson                    MRN: [id number] Adm. Date:  [id number] Attending:  Lorre Nick                         History and Physical  HISTORY OF PRESENT ILLNESS:  This is a 40 year old, white female who comes in with abdominal pain which began in her mid abdomen located to her right lower quadrant.  It began about 9:30 p.m. last night.  PAST MEDICAL HISTORY:  No significant past medical history.  No history of peptic ulcer disease, liver disease, pancreatic disease, change in bowel habits.  She has had no prior abdominal surgery.  She had had a ruptured ovarian cyst a number of years ago, but it spontaneously resolved requiring no operation.  ALLERGIES:  No known drug allergies.  MEDICATIONS:  Ibuprofen.  REVIEW OF SYSTEMS:  Pulmonary: Does not smoke cigarettes, no pneumonia or tuberculosis.  Cardiac: No heart disease, chest pain or hypertension. Gastrointestinal: See history of present illness.  Urologic:  No history of kidney infections.  GYN: She is a G2, P2, AB0.  Her last period was about a week ago.  SOCIAL HISTORY:  Her husband is in the ER with her and she works at [employer].  Her only prior other injury was a broken clavicle which did not require surgery.  PHYSICAL EXAMINATION:  VITAL SIGNS:  Temperature 98.3, blood pressure 122/73, pulse 100, respiratory rate 24.  HEENT:  Unremarkable.  NECK:  Supple.  She has no masses, no thyromegaly.  LUNGS:  Clear to auscultation.  HEART:  Regular rate and rhythm without murmur or rub.  ABDOMEN:  She has decreased bowel sounds.  She is tender with guarding and rebound in right lower quadrant.  I feel no mass, no tenderness or hernia.  EXTREMITIES:  She has good strength in all four extremities.  NEUROLOGIC:  Grossly intact.  LABORATORY DATA AND X-RAY FINDINGS:  Her urine is unremarkable.  Her urine pregnancy was negative.  White blood  count 11,600 with 78% neutrophils, hemoglobin 13.8, hematocrit 40.6.  Electrolytes all within normal limits.  Review of her CT scan shows inflammatory appendix behind her cecum.  She was given a rectal contrast for this as no other obvious apparent problem.  IMPRESSION/PLAN:  Appendicitis.  Discussed with patient and her husband that of attempted laparoscopic appendectomy.  We talked about the indications and complications of the procedure.  Complications included, but not limited to, bleeding infection and possible open surgery.  Plan to operate on the patient the day of admission. DD:  12/08/00 TD:  12/08/00 Job: [id number]

## 2011-01-05 NOTE — Assessment & Plan Note (Signed)
[facility]                           GASTROENTEROLOGY OFFICE NOTE   [name], [name]                    MRN:          [id number]  DATE:05/06/2006                            DOB:          05-14-1971    PROBLEM:  Nausea.   Jaime Richardson has returned for a scheduled GI followup.  Upper endoscopy was  entirely normal.  CT scan was also unrevealing.  CMET, CBC and amylase were  also normal.  Jaime Richardson continues to complain of nausea.  She has some  upper abdominal fullness, as well.   MEDICATIONS:  She is on no regular medicines.   PHYSICAL EXAMINATION:  VITAL SIGNS:  Pulse 74, blood pressure 116/70, weight  138.   IMPRESSION:  Persistent nausea with upper abdominal discomfort.  At this  point, etiology is not clear.   RECOMMENDATIONS:  Gastric emptying scan.  If negative, I will obtain a  hepatobiliary scan, though the ultrasound was negative.                                   Barbette Hair. Arlyce Dice, MD,FACG   RDK/MedQ  DD:  05/06/2006  DT:  05/07/2006  Job #:  [id number]   cc:   Burnell Blanks, MD

## 2011-01-05 NOTE — Op Note (Signed)
[facility]  Patient:    Jaime Richardson, Jaime Richardson Visit Number: [id number] MRN: [id number]          Service Type: DSU Location: [facility] Attending Physician:  Minette Headland Dictated by:   Freddy Finner, M.D. Proc. Date: 04/11/01 Adm. Date:  04/11/2001                             Operative Report  PREOPERATIVE DIAGNOSIS:       Multiparity, requests surgical sterilization.  POSTOPERATIVE DIAGNOSIS:      Multiparity, requests surgical sterilization.  SURGEON:  OPERATIVE PROCEDURE:          Laparoscopic tubal ligation, with application of Filshie clips.  ESTIMATED BLOOD LOSS:         Less than 5 cc.  INTRAOPERATIVE COMPLICATIONS: None.  INDICATIONS:                  The patient is a 40 year old with two living children, who is requesting definitive surgical sterilization.  Is here at this time for that purpose.  Other options were discussed with her preoperatively.  INTRAOPERATIVE FINDINGS:      Normal pelvis.  Normal uterus, tubes and ovaries.  No evidence of adhesions or endometriosis.  No abnormality was apparent in the upper abdomen.  DESCRIPTION OF PROCEDURE:     The patient was identified on the morning of surgery, brought to the operating room and there placed under adequate general endotracheal anesthesia.  She was placed in the dorsal lithotomy position, using the [employer] system.  Betadine prep was carried out in the usual fashion.  The bladder was evacuated with sterile technique.  Hulka tenaculum was attached to the cervix.  Sterile drapes were applied.  A small incision was made at the umbilicus.  Following this an 11 mm trocar was introduced without difficulty.  Direct inspection revealed adequate placement and without injury on entry.  Pneumoperitoneum was allowed to accumulate with carbon dioxide gas.  The patient was placed in strict Trendelenburg position.  The uterus was elevated with the Hulka tenaculum.  A Filshie clip  was applied at each tube at the isthmic portion.  The clip applying device was used through the operating channel of the laparoscope.  The procedure at this point was terminated.  The gas was allowed to escape from the abdomen.  Marcaine 0.5% plain was injected at the incision site for postoperative analgesia.  The patient was given 30 mg of Toradol IV.  She was taken to recovery room in good condition.  DISPOSITION:                  Will be discharged in the immediate postoperative period.  Will follow up in the office in two weeks.  She is given Vicodin to be taken as needed for postoperative pain.Dictated by:   Freddy Finner, M.D. Attending Physician:  Minette Headland DD:  04/11/01 TD:  04/11/01 Job: [id number]

## 2011-01-05 NOTE — Assessment & Plan Note (Signed)
[facility]                           GASTROENTEROLOGY OFFICE NOTE   Jaime Richardson, Jaime Richardson                    MRN:          [id number]  DATE:04/26/2006                            DOB:          1970/12/07    REASON FOR CONSULTATION:  Abdominal pain with nausea and vomiting.   Jaime Richardson is a pleasant 40 year old white female referred through the  courtesy of Dr. Nathanial Rancher for evaluation.  Over the last 10-12 days she has  been suffering from persistent nausea with intermittent vomiting.  Symptoms  began with severe periumbilical pain that radiated to her back.  This was  accompanied by nausea and vomiting.  She was seen at [facility] ER where  workup was pertinent for a normal abdominal ultrasound and transvaginal and  pelvic ultrasound.  Laboratory demonstrated normal CBC.  LFTs were pertinent  for a bilirubin of 1.6, otherwise normal.  The pain has subsided  spontaneously, though she continues to have freestanding nausea which is  worsened with eating.  She has had intermittent vomiting.  She has been  moving her bowels regularly throughout.  She is without fever, myalgias or  pyrosis.  She has had no prior GI complaints.  She has recently had a head  MRI because of bouts of confusion.  She is on no medications including  nonsteroidals.  She drinks rarely.   PAST MEDICAL HISTORY:  Unremarkable.  She is status post tubal ligation and  appendectomy.   FAMILY HISTORY:  Noncontributory.   MEDICATIONS:  SHE IS ALLERGIC TO DILAUDID.   She neither smokes or drinks.  She is married and works as a Teacher, music.   REVIEW OF SYSTEMS:  Positive for occasional back pain.   PHYSICAL EXAMINATION:  Pulse 80, blood pressure 118/64, weight 141.  HEENT: EOMI. PERRLA. Sclerae are anicteric.  Conjunctivae are pink.  NECK:  Supple without thyromegaly, adenopathy or carotid bruits.  CHEST:  Clear to auscultation and percussion without adventitious sounds.  CARDIAC:  Regular rhythm; normal S1 S2.  There are no murmurs, gallops or  rubs.  ABDOMEN:  Normoactive bowel sounds.  There is mild periumbilical tenderness  without guarding and rebound.  There are no abdominal masses or  organomegaly.  She has a palpable aorta without bruits.  EXTREMITIES:  Full range of motion.  No cyanosis, clubbing or edema.  RECTAL:  Deferred.   IMPRESSION:  Persistent nausea with postprandial vomiting.  With initial  presentation of abdominal pain, symptoms raise the question of active peptic  disease.  Pain from pancreatitis is also a concern.  There is no ultrasound  evidence for biliary tract disease, though this does remain a concern.  Exam  does not suggest an obstruction.   RECOMMENDATION:  1. Check amylase, lipase and repeat LFTs and CBC.  2. Begin Phenergan 25 mg p.o. q.6h. p.r.n. and Zantac 150 mg a day.  3. Upper endoscopy.  4. To consider CT of the abdomen if the above tests are not diagnostic and      patient is nonimproved.  Jaime Richardson. Jaime Dice, MD,FACG   RDK/MedQ  DD:  04/26/2006  DT:  04/26/2006  Job #:  [id number]   cc:   Jaime Blanks, MD  Jaime Richardson, M.D.

## 2011-01-05 NOTE — Op Note (Signed)
Jaime Richardson             ACCOUNT NO.:  [id number]   MEDICAL RECORD NO.:  [id number]          PATIENT TYPE:  OIB   LOCATION:  5702                         FACILITY:  [facility]   PHYSICIAN:  Sandria Bales. Ezzard Standing, M.D.  DATE OF BIRTH:  1971/03/31   DATE OF PROCEDURE:  07/15/2006  DATE OF DISCHARGE:  07/16/2006                               OPERATIVE REPORT   PREOPERATIVE DIAGNOSIS:  Biliary dyskinesia.   POSTOPERATIVE DIAGNOSIS:  Biliary dyskinesia with evidence of chronic  cholecystitis.   PROCEDURE:  Laparoscopic cholecystectomy with intraoperative  cholangiogram.   SURGEON:  Ovidio Kin, MD   FIRST ASSISTANT:  Lebron Conners, M.D.   ANESTHESIA:  General endotracheal with approximately 20 mL of 0.25%  Marcaine.   COMPLICATIONS:  None.   INDICATIONS FOR PROCEDURE:  Jaime Richardson is a 40 year old white female who  has had repeated GI symptoms and is a patient of Dr. Sharman Crate Hamrick.  She  had been through a thorough evaluation by Dr. Melvia Heaps.  She has  been found to have a gallbladder ejection fraction of about 46%.  This  is a little bit below the 50% considered normal.   A discussion was carried out with the patient about options for  proceeding with surgery for gallbladder disease.  The indications and  potential complications were discussed.  The potential complications  include but not limited to bleeding, infection, bile duct injury, and  the possibility of open surgery.  Also, because she has just biliary  dyskinesia, an understanding that her symptoms may not resolve with  surgery.   OPERATIVE NOTE:  The patient was placed in the supine position and given  a general endotracheal anesthetic.  Her abdomen was prepped with  Betadine solution and sterilely draped.  She was given 1 gram of  Cefoxitin to initiate the procedure.   I went through an infraumbilical incision, which she already had a scar  into her abdominal cavity.  A 0-degree 10 mm laparoscope was  inserted  through a 12-mm Hasson trocar.  The Hasson trocar was secured with a 0  Vicryl suture.  I placed a 10-mm subxiphoid trocar, a 5-mm right mid  subcostal, and a 5-mm lateral subcostal trocar.  Abdominal exploration  revealed the right and left lobes of the liver unremarkable.  The  anterior wall of the stomach was unremarkable.  The bowel, that I could  see, was unremarkable.  She did have one adhesive band from the omentum  up to her umbilicus.  I take it this is from her old incision; we took  this down.  I then turned my attention to the gallbladder.  The  gallbladder did have scar tissue around it with adhesions of omentum and  duodenum up to the anterior wall of the gallbladder.  This was all  consistent with probable chronic gallbladder disease.  I took this down  sharply and bluntly.   I did find the cystic duct, gallbladder junction.  I placed a clip on  the gallbladder side of the cystic duct.  An intraoperative  cholangiogram with a cut-off Taut catheter inserted through a 14-gauge  Jelco and the Taut catheter was secured with an endoclip.   I injected about 8 mL of 50/50 mixed Hyapaque under direct fluoroscopy.  The patient did have a couple of air bubbles, which I saw float through  the common bile duct, but she had no documented gall  stones.  I did not  think these represented any kind of stone debris, but really more air  bubbles.  She also had a little bit of a leak at the end of the  injection.  I did think her anatomy of her common bile duct was normal.  There was free flow in the duodenum if retroflexed contrast up the  hepatic radicals.  The Taut catheter was then removed.   The cystic duct triply clipped and divided.  I found the anterior and  posterior branch of the cystic artery, which were doubly clipped and  divided.  The gallbladder was sharply and bluntly dissected from the  gallbladder bed using primary Hook cautery prior to complete division of   the gallbladder from the gallbladder bed.  The abdomen was irrigated.  There again, was this one branch of the cystic artery tracked up in the  gallbladder bed, about midway of the bed that I put a clip on.  I then  divided the gallbladder, delivered the gallbladder through the  umbilicus.  Again, I palpated no obvious stones.  The upper abdomen was  irrigated with 500 mL of saline.  The umbilical port was closed with 0  Vicryl suture.  I infiltrated each port with a total of about 20 mL of  0.25% Marcaine.  I closed the skin with 5-0 Vicryl suture.   Each wound  was painted with a tincture of benzoin and Steri-Strips.  The patient tolerated the procedure well and was transported to the  recovery room in good condition.  Sponge and needle counts were correct  at the end of the case.      Sandria Bales. Ezzard Standing, M.D.  Electronically Signed     DHN/MEDQ  D:  07/15/2006  T:  07/16/2006  Job:  [id number]   cc:   Barbette Hair. Arlyce Dice, MD,FACG  Burnell Blanks, MD

## 2015-08-09 ENCOUNTER — Encounter: Payer: Self-pay | Admitting: Gastroenterology

## 2019-12-02 DIAGNOSIS — M25512 Pain in left shoulder: Secondary | ICD-10-CM | POA: Diagnosis not present

## 2019-12-02 DIAGNOSIS — M542 Cervicalgia: Secondary | ICD-10-CM | POA: Diagnosis not present

## 2019-12-09 DIAGNOSIS — M6281 Muscle weakness (generalized): Secondary | ICD-10-CM | POA: Diagnosis not present

## 2019-12-09 DIAGNOSIS — M25512 Pain in left shoulder: Secondary | ICD-10-CM | POA: Diagnosis not present

## 2019-12-16 DIAGNOSIS — M6281 Muscle weakness (generalized): Secondary | ICD-10-CM | POA: Diagnosis not present

## 2019-12-16 DIAGNOSIS — M25512 Pain in left shoulder: Secondary | ICD-10-CM | POA: Diagnosis not present

## 2019-12-23 DIAGNOSIS — M6281 Muscle weakness (generalized): Secondary | ICD-10-CM | POA: Diagnosis not present

## 2019-12-23 DIAGNOSIS — M25512 Pain in left shoulder: Secondary | ICD-10-CM | POA: Diagnosis not present

## 2020-01-05 DIAGNOSIS — M6281 Muscle weakness (generalized): Secondary | ICD-10-CM | POA: Diagnosis not present

## 2020-01-05 DIAGNOSIS — M25512 Pain in left shoulder: Secondary | ICD-10-CM | POA: Diagnosis not present

## 2020-01-06 DIAGNOSIS — M25512 Pain in left shoulder: Secondary | ICD-10-CM | POA: Diagnosis not present

## 2020-05-11 DIAGNOSIS — M25512 Pain in left shoulder: Secondary | ICD-10-CM | POA: Diagnosis not present

## 2020-06-02 DIAGNOSIS — M25512 Pain in left shoulder: Secondary | ICD-10-CM | POA: Diagnosis not present

## 2020-06-06 DIAGNOSIS — M25512 Pain in left shoulder: Secondary | ICD-10-CM | POA: Diagnosis not present

## 2020-06-07 DIAGNOSIS — R1084 Generalized abdominal pain: Secondary | ICD-10-CM | POA: Diagnosis not present

## 2020-06-14 DIAGNOSIS — R1013 Epigastric pain: Secondary | ICD-10-CM | POA: Diagnosis not present

## 2020-06-14 DIAGNOSIS — Z789 Other specified health status: Secondary | ICD-10-CM | POA: Diagnosis not present

## 2020-06-14 DIAGNOSIS — E663 Overweight: Secondary | ICD-10-CM | POA: Diagnosis not present

## 2020-06-14 DIAGNOSIS — Z6827 Body mass index (BMI) 27.0-27.9, adult: Secondary | ICD-10-CM | POA: Diagnosis not present

## 2020-09-13 LAB — RESULTS CONSOLE HPV: CHL HPV: NEGATIVE

## 2020-09-13 LAB — HM MAMMOGRAPHY: HM Mammogram: NORMAL (ref 0–4)

## 2020-09-13 LAB — HM PAP SMEAR

## 2020-09-21 LAB — HM PAP SMEAR: HPV, high-risk: NEGATIVE

## 2021-12-11 ENCOUNTER — Encounter: Payer: Self-pay | Admitting: Nurse Practitioner

## 2021-12-11 ENCOUNTER — Ambulatory Visit (INDEPENDENT_AMBULATORY_CARE_PROVIDER_SITE_OTHER): Payer: 59 | Admitting: Nurse Practitioner

## 2021-12-11 VITALS — BP 107/65 | HR 82 | Temp 97.8°F | Ht 64.57 in | Wt 161.2 lb

## 2021-12-11 DIAGNOSIS — R5383 Other fatigue: Secondary | ICD-10-CM

## 2021-12-11 DIAGNOSIS — R079 Chest pain, unspecified: Secondary | ICD-10-CM | POA: Diagnosis not present

## 2021-12-11 DIAGNOSIS — Z6827 Body mass index (BMI) 27.0-27.9, adult: Secondary | ICD-10-CM | POA: Diagnosis not present

## 2021-12-11 DIAGNOSIS — Z7689 Persons encountering health services in other specified circumstances: Secondary | ICD-10-CM

## 2021-12-11 DIAGNOSIS — Z8249 Family history of ischemic heart disease and other diseases of the circulatory system: Secondary | ICD-10-CM | POA: Insufficient documentation

## 2021-12-11 NOTE — Progress Notes (Signed)
? ?New Patient Office Visit ? ?Subjective   ? ?Patient ID: Jaime Richardson, female    DOB: [birth year]  Age: 51 y.o. MRN: [id number] ? ?CC:  ?Chief Complaint  ?Patient presents with  ? New Patient (Initial Visit)  ? ? ?HPI ?Jaime Richardson presents to establish care ?The patient states that she has not had pcp In a few years  ?-few weeks ago, had episode of vertigo, sweating, dizziness, left chest pain, and a moment when it was difficult to catch her breath. States that she had three similar episodes in the same week, but has not had anything since then.  ?-she denies n/v. Denies left arm pain or jaw pain.  ?-reports increased fatigue.  ?-strong family history of CAD.  ?-she has had partial hysterectomy, so no heavy menstrual periods . ? ?No outpatient encounter medications on file as of 12/11/2021.  ? ?No facility-administered encounter medications on file as of 12/11/2021.  ? ? ?History reviewed. No pertinent past medical history. ? ?History reviewed. No pertinent surgical history. ? ?Family History  ?Problem Relation Age of Onset  ? Heart attack Mother   ? Leukemia Mother   ? Heart attack Father   ? Alcoholism Father   ? Stroke Maternal Grandmother   ? Heart attack Maternal Grandmother   ? Breast cancer Maternal Grandmother   ? Heart attack Maternal Grandfather   ? Alcoholism Maternal Grandfather   ? ? ?Social History  ? ?Socioeconomic History  ? Marital status: Married  ?  Spouse name: Not on file  ? Number of children: Not on file  ? Years of education: Not on file  ? Highest education level: Not on file  ?Occupational History  ? Not on file  ?Tobacco Use  ? Smoking status: Never  ? Smokeless tobacco: Never  ?Vaping Use  ? Vaping Use: Not on file  ?Substance and Sexual Activity  ? Alcohol use: Yes  ? Drug use: Never  ? Sexual activity: Not on file  ?Other Topics Concern  ? Not on file  ?Social History Narrative  ? Not on file  ? ?Social Determinants of Health  ? ?Financial Resource Strain: Not on file  ?Food  Insecurity: Not on file  ?Transportation Needs: Not on file  ?Physical Activity: Not on file  ?Stress: Not on file  ?Social Connections: Not on file  ?Intimate Partner Violence: Not on file  ? ? ?Review of Systems  ?Constitutional:  Positive for malaise/fatigue. Negative for chills and fever.  ?HENT:  Negative for congestion, ear pain, sinus pain and sore throat.   ?Eyes: Negative.   ?Respiratory:  Positive for shortness of breath. Negative for cough and wheezing.   ?Cardiovascular:  Negative for chest pain and palpitations.  ?     Had few episodes of chest discomfort with shortness of breath and vertigo. Happened three times in one week and not again since then. Strong family history of CAD.   ?Gastrointestinal:  Positive for heartburn. Negative for constipation.  ?Genitourinary: Negative.   ?Musculoskeletal:  Negative for back pain and myalgias.  ?Skin:  Negative for itching and rash.  ?Neurological:  Negative for dizziness, weakness and headaches.  ?Psychiatric/Behavioral:  Negative for depression. The patient is not nervous/anxious.   ? ?  ? ? ?Objective   ? ?Today's Vitals  ? 12/11/21 1503  ?BP: 107/65  ?Pulse: 82  ?Temp: 97.8 ?F (36.6 ?C)  ?SpO2: 99%  ?Weight: 161 lb 3.2 oz (73.1 kg)  ?Height: 5' 4.57" (1.64 m)  ? ?  Body mass index is 27.19 kg/m?.  ? ?Physical Exam ?Vitals and nursing note reviewed.  ?Constitutional:   ?   Appearance: Normal appearance. She is well-developed.  ?HENT:  ?   Head: Normocephalic and atraumatic.  ?Eyes:  ?   Pupils: Pupils are equal, round, and reactive to light.  ?Cardiovascular:  ?   Rate and Rhythm: Normal rate and regular rhythm.  ?   Pulses: Normal pulses.  ?   Heart sounds: Normal heart sounds.  ?   Comments: ECG today is within normal limits  ?Pulmonary:  ?   Effort: Pulmonary effort is normal.  ?   Breath sounds: Normal breath sounds.  ?Abdominal:  ?   Palpations: Abdomen is soft.  ?Musculoskeletal:     ?   General: Normal range of motion.  ?   Cervical back: Normal range of  motion and neck supple.  ?Lymphadenopathy:  ?   Cervical: No cervical adenopathy.  ?Skin: ?   General: Skin is warm and dry.  ?   Capillary Refill: Capillary refill takes less than 2 seconds.  ?Neurological:  ?   General: No focal deficit present.  ?   Mental Status: She is alert and oriented to person, place, and time.  ?Psychiatric:     ?   Mood and Affect: Mood normal.     ?   Behavior: Behavior normal.     ?   Thought Content: Thought content normal.     ?   Judgment: Judgment normal.  ? ? ? ? ?Assessment & Plan:  ?1. Chest pain, unspecified type ?ECG today is within normal limits. Pain could be GERD, anxiety, musculoskeletal, or anemia. Will get fasting labs along with anemia panel for further evaluation. Consider referral to cardiology due to strong family history of CAD.  ?- EKG 12-Lead ? ?2. Family history of coronary artery disease ?Consider referral to cardiology due to strong family history of CAD and recent cardiac type symptoms.  ? ?3. Other fatigue ?Check labs which include anemia panel, CBC, CMP, thyroid panel, fasting lipids, and vitamin d for further evaluation. Review results at next visit in 2 weeks.  ? ?4. Body mass index 27.0-27.9, adult ?Discussed lowering calorie intake to 1500 calories per day and incorporating exercise into daily routine to help lose weight.  ? ?5. Encounter to establish care ?Appointment today to establish new primary care provider. Will get progress notes from GYN to review and update patent chart.  ? ?  ?Problem List Items Addressed This Visit   ? ?  ? Other  ? Chest pain - Primary  ? Relevant Orders  ? EKG 12-Lead  ? Body mass index 27.0-27.9, adult  ? Family history of coronary artery disease  ? Other fatigue  ? ?Other Visit Diagnoses   ? ? Encounter to establish care      ? ?  ? ? ?Return in about 2 weeks (around 12/25/2021), or CPE - fasting blood work any morning in the next week., for labs - , FBW, Free T4, ferritin, B12, vitamin d - please get pap notes from Dr.  Julien Girt. .  ? ?Ronnell Freshwater, NP ? ? ?

## 2021-12-19 ENCOUNTER — Other Ambulatory Visit: Payer: 59

## 2021-12-19 DIAGNOSIS — R5383 Other fatigue: Secondary | ICD-10-CM

## 2021-12-19 DIAGNOSIS — E611 Iron deficiency: Secondary | ICD-10-CM

## 2021-12-19 DIAGNOSIS — Z0000 Encounter for general adult medical examination without abnormal findings: Secondary | ICD-10-CM

## 2021-12-20 LAB — HEMOGLOBIN A1C
Est. average glucose Bld gHb Est-mCnc: 114 mg/dL
Hgb A1c MFr Bld: 5.6 % (ref 4.8–5.6)

## 2021-12-20 LAB — LIPID PANEL
Chol/HDL Ratio: 3.6 ratio (ref 0.0–4.4)
Cholesterol, Total: 204 mg/dL — ABNORMAL HIGH (ref 100–199)
HDL: 56 mg/dL (ref >39)
LDL Chol Calc (NIH): 130 mg/dL — ABNORMAL HIGH (ref 0–99)
Triglycerides: 98 mg/dL (ref 0–149)
VLDL Cholesterol Cal: 18 mg/dL (ref 5–40)

## 2021-12-20 LAB — CBC WITH DIFFERENTIAL/PLATELET
Basophils Absolute: 0.1 10*3/uL (ref 0.0–0.2)
Basos: 1 %
EOS (ABSOLUTE): 0.1 10*3/uL (ref 0.0–0.4)
Eos: 3 %
Hematocrit: 43.3 % (ref 34.0–46.6)
Hemoglobin: 14.3 g/dL (ref 11.1–15.9)
Immature Grans (Abs): 0.1 10*3/uL (ref 0.0–0.1)
Immature Granulocytes: 1 %
Lymphocytes Absolute: 1.7 10*3/uL (ref 0.7–3.1)
Lymphs: 31 %
MCH: 28 pg (ref 26.6–33.0)
MCHC: 33 g/dL (ref 31.5–35.7)
MCV: 85 fL (ref 79–97)
Monocytes Absolute: 0.5 10*3/uL (ref 0.1–0.9)
Monocytes: 10 %
Neutrophils Absolute: 3 10*3/uL (ref 1.4–7.0)
Neutrophils: 54 %
Platelets: 249 10*3/uL (ref 150–450)
RBC: 5.11 x10E6/uL (ref 3.77–5.28)
RDW: 12.2 % (ref 11.7–15.4)
WBC: 5.5 10*3/uL (ref 3.4–10.8)

## 2021-12-20 LAB — TSH: TSH: 2.06 u[IU]/mL (ref 0.450–4.500)

## 2021-12-20 LAB — COMPREHENSIVE METABOLIC PANEL
ALT: 15 IU/L (ref 0–32)
AST: 37 IU/L (ref 0–40)
Albumin/Globulin Ratio: 1.8 (ref 1.2–2.2)
Albumin: 4.4 g/dL (ref 3.8–4.8)
Alkaline Phosphatase: 126 IU/L — ABNORMAL HIGH (ref 44–121)
BUN/Creatinine Ratio: 21 (ref 9–23)
BUN: 19 mg/dL (ref 6–24)
Bilirubin Total: 0.8 mg/dL (ref 0.0–1.2)
CO2: 25 mmol/L (ref 20–29)
Calcium: 9.4 mg/dL (ref 8.7–10.2)
Chloride: 102 mmol/L (ref 96–106)
Creatinine, Ser: 0.89 mg/dL (ref 0.57–1.00)
Globulin, Total: 2.4 g/dL (ref 1.5–4.5)
Glucose: 82 mg/dL (ref 70–99)
Potassium: 4.4 mmol/L (ref 3.5–5.2)
Sodium: 140 mmol/L (ref 134–144)
Total Protein: 6.8 g/dL (ref 6.0–8.5)
eGFR: 79 mL/min/{1.73_m2} (ref >59)

## 2021-12-20 LAB — FERRITIN: Ferritin: 119 ng/mL (ref 15–150)

## 2021-12-20 LAB — T4, FREE: Free T4: 1.86 ng/dL — ABNORMAL HIGH (ref 0.82–1.77)

## 2021-12-20 LAB — VITAMIN B12: Vitamin B-12: 866 pg/mL (ref 232–1245)

## 2021-12-20 LAB — VITAMIN D 25 HYDROXY (VIT D DEFICIENCY, FRACTURES): Vit D, 25-Hydroxy: 44.4 ng/mL (ref 30.0–100.0)

## 2021-12-21 NOTE — Progress Notes (Signed)
Slight elevation of Free T4. Mild elevation of LDL and total cholesterol. Discuss with patient at visit 12/25/2021

## 2021-12-25 ENCOUNTER — Encounter: Payer: Self-pay | Admitting: Nurse Practitioner

## 2021-12-25 ENCOUNTER — Ambulatory Visit (INDEPENDENT_AMBULATORY_CARE_PROVIDER_SITE_OTHER): Payer: 59 | Admitting: Nurse Practitioner

## 2021-12-25 VITALS — BP 117/71 | HR 78 | Temp 98.1°F | Ht 64.57 in | Wt 161.0 lb

## 2021-12-25 DIAGNOSIS — Z6827 Body mass index (BMI) 27.0-27.9, adult: Secondary | ICD-10-CM | POA: Diagnosis not present

## 2021-12-25 DIAGNOSIS — F988 Other specified behavioral and emotional disorders with onset usually occurring in childhood and adolescence: Secondary | ICD-10-CM | POA: Diagnosis not present

## 2021-12-25 DIAGNOSIS — Z0001 Encounter for general adult medical examination with abnormal findings: Secondary | ICD-10-CM

## 2021-12-25 DIAGNOSIS — E785 Hyperlipidemia, unspecified: Secondary | ICD-10-CM

## 2021-12-25 MED ORDER — AMPHETAMINE-DEXTROAMPHETAMINE 5 MG PO TABS: 5.0000 mg | ORAL_TABLET | Freq: Two times a day (BID) | ORAL | 0 refills | Status: DC | PRN

## 2021-12-25 NOTE — Progress Notes (Signed)
? ?Complete physical exam ? ?Patient: Jaime Richardson   DOB: 1971/03/06   51 y.o. Female  MRN: [id number] ? ?Subjective:  ?  ?Chief Complaint  ?Patient presents with  ? Annual Exam  ? ? ?Jaime Richardson is a 51 y.o. female who presents today for a complete physical exam. She reports consuming a generally well balanced diet. States that going out to eat on busy days can be problematic. The patient does not participate in regular exercise at present. She generally feels more tired than she would like to be. Has had lack of concentration in past few months.  She reports sleeping fairly well. She does have additional problems to discuss today.  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 They were associated with her menstrual cycle. Did have prophylactic hysterectomy. She has not had seizure since having hysterectomy.  ?Routine fasting labs done prior to this visit.  ?-mild elevation of LDL and total cholesterol  ?-TSH normal with slightly elevated Free t4 ?-alkaline phosphatase mildly elevated.  ?-other labs essentially normal.  ? ?Adult ADHD Self Report Scale (most recent)   ? ? Adult ADHD Self-Report Scale (ASRS-v1.1) Symptom Checklist - 12/25/21 1506   ? ?  ? Part A  ? 1. How often do you have trouble wrapping up the final details of a project, once the challenging parts have been done? Never  2. How often do you have difficulty getting things done in order when you have to do a task that requires organization? Often   ? 3. How often do you have problems remembering appointments or obligations? Sometimes  4. When you have a task that requires a lot of thought, how often do you avoid or delay getting started? Sometimes   ? 5. How often do you fidget or squirm with your hands or feet when you have to sit down for a long time? Sometimes  6. How often do you feel overly active and compelled to do things, like you were driven by a motor? Often   ?  ? Part B  ? 7. How often do you make careless  mistakes when you have to work on a boring or difficult project? Often  8. How often do you have difficulty keeping your attention when you are doing boring or repetitive work? Very Often   ? 9. How often do you have difficulty concentrating on what people say to you, even when they are speaking to you directly? Sometimes  10. How often do you misplace or have difficulty finding things at home or at work? Sometimes   ? 11. How often are you distracted by activity or noise around you? Often  12. How often do you leave your seat in meetings or other situations in which you are expected to remain seated? Sometimes   ? 13. How often do you feel restless or fidgety? Sometimes  14. How often do you have difficulty unwinding and relaxing when you have time to yourself? Often   ? 15. How often do you find yourself talking too much when you are in social situations? Never  16. When you are in a conversation, how often do you find yourself finishing the sentences of the people you are talking to, before they can finish them themselves? Sometimes   ? 17. How often do you have difficulty waiting your turn in situations when turn taking is required? Rarely  18. How often do you interrupt others when they are busy?  Sometimes   ?  ? Comment  ? How old were you when these problems first began to occur? 52     ? ?  ?  ? ?  ?  ? ? ?Most recent fall risk assessment: ? ?  12/25/2021  ?  2:43 PM  ?Fall Risk   ?Falls in the past year? 0  ?Number falls in past yr: 0  ?Injury with Fall? 0  ?Follow up Falls evaluation completed  ? ?  ?Most recent depression screenings: ? ?  12/25/2021  ?  2:43 PM 12/11/2021  ?  3:11 PM  ?PHQ 2/9 Scores  ?PHQ - 2 Score 0 0  ?PHQ- 9 Score 3 3  ? ? ? ? ?Patient Active Problem List  ? Diagnosis Date Noted  ? Hyperlipidemia LDL goal <100 12/31/2021  ? Attention deficit disorder (ADD) in adult 12/31/2021  ? Chest pain 12/11/2021  ? Body mass index 27.0-27.9, adult 12/11/2021  ? Family history of coronary artery  disease 12/11/2021  ? Other fatigue 12/11/2021  ? ?History reviewed. No pertinent past medical history. ?History reviewed. No pertinent surgical history. ?Social History  ? ?Tobacco Use  ? Smoking status: Never  ? Smokeless tobacco: Never  ?Substance Use Topics  ? Alcohol use: Yes  ? Drug use: Never  ? ?Family Status  ?Relation Name Status  ? Mother  (Not Specified)  ? Father  (Not Specified)  ? MGM  (Not Specified)  ? MGF  (Not Specified)  ? ?  ? ?Patient Care Team: ?Ronnell Freshwater, NP as PCP - General (Family Medicine)  ? ?No outpatient medications prior to visit.  ? ?No facility-administered medications prior to visit.  ? ? ?Review of Systems  ?Constitutional:  Positive for malaise/fatigue. Negative for chills and fever.  ?HENT:  Negative for congestion, sinus pain and sore throat.   ?Eyes: Negative.   ?Respiratory:  Negative for cough, shortness of breath and wheezing.   ?Cardiovascular:  Negative for chest pain, palpitations and leg swelling.  ?Gastrointestinal:  Negative for constipation, diarrhea, nausea and vomiting.  ?Genitourinary: Negative.   ?Musculoskeletal:  Negative for myalgias.  ?Skin: Negative.   ?Neurological:  Negative for dizziness and headaches.  ?     History of seizures which were associated with her menstrual cycle.   ?Endo/Heme/Allergies:  Does not bruise/bleed easily.  ?Psychiatric/Behavioral:  Negative for depression. The patient is not nervous/anxious.   ?     Decreased concentration   ? ? ? ? ?   ?Objective:  ? ?  ?Today's Vitals  ? 12/25/21 1440  ?BP: 117/71  ?Pulse: 78  ?Temp: 98.1 ?F (36.7 ?C)  ?SpO2: 94%  ?Weight: 161 lb (73 kg)  ?Height: 5' 4.57" (1.64 m)  ? ?Body mass index is 27.15 kg/m?.  ? ?BP Readings from Last 3 Encounters:  ?12/25/21 117/71  ?12/11/21 107/65  ? ?  ? ?Physical Exam ?Vitals and nursing note reviewed.  ?Constitutional:   ?   Appearance: Normal appearance. She is well-developed.  ?HENT:  ?   Head: Normocephalic and atraumatic.  ?   Right Ear: Tympanic  membrane, ear canal and external ear normal.  ?   Left Ear: Tympanic membrane, ear canal and external ear normal.  ?   Nose: Nose normal.  ?   Mouth/Throat:  ?   Mouth: Mucous membranes are moist.  ?   Pharynx: Oropharynx is clear.  ?Eyes:  ?   Extraocular Movements: Extraocular movements intact.  ?   Conjunctiva/sclera: Conjunctivae  normal.  ?   Pupils: Pupils are equal, round, and reactive to light.  ?Cardiovascular:  ?   Rate and Rhythm: Normal rate and regular rhythm.  ?   Pulses: Normal pulses.  ?   Heart sounds: Normal heart sounds.  ?Pulmonary:  ?   Effort: Pulmonary effort is normal.  ?   Breath sounds: Normal breath sounds.  ?Abdominal:  ?   General: Bowel sounds are normal. There is no distension.  ?   Palpations: Abdomen is soft. There is no mass.  ?   Tenderness: There is no abdominal tenderness. There is no right CVA tenderness, left CVA tenderness, guarding or rebound.  ?   Hernia: No hernia is present.  ?Musculoskeletal:     ?   General: Normal range of motion.  ?   Cervical back: Normal range of motion and neck supple.  ?Lymphadenopathy:  ?   Cervical: No cervical adenopathy.  ?Skin: ?   General: Skin is warm and dry.  ?   Capillary Refill: Capillary refill takes less than 2 seconds.  ?Neurological:  ?   General: No focal deficit present.  ?   Mental Status: She is alert and oriented to person, place, and time. Mental status is at baseline.  ?Psychiatric:     ?   Mood and Affect: Mood normal.     ?   Behavior: Behavior normal.     ?   Thought Content: Thought content normal.     ?   Judgment: Judgment normal.  ?   Comments: Patient scored 3/6 on ASRS 1.1 self assessment for adult onset ADHD   ?  ? ?Last CBC ?Lab Results  ?Component Value Date  ? WBC 5.5 12/19/2021  ? HGB 14.3 12/19/2021  ? HCT 43.3 12/19/2021  ? MCV 85 12/19/2021  ? MCH 28.0 12/19/2021  ? RDW 12.2 12/19/2021  ? PLT 249 12/19/2021  ? ?Last metabolic panel ?Lab Results  ?Component Value Date  ? GLUCOSE 82 12/19/2021  ? NA 140  12/19/2021  ? K 4.4 12/19/2021  ? CL 102 12/19/2021  ? CO2 25 12/19/2021  ? BUN 19 12/19/2021  ? CREATININE 0.89 12/19/2021  ? EGFR 79 12/19/2021  ? CALCIUM 9.4 12/19/2021  ? PROT 6.8 12/19/2021  ? ALBUMIN 4.4 12/19/2021  ? LAB

## 2021-12-26 ENCOUNTER — Other Ambulatory Visit: Payer: Self-pay | Admitting: Nurse Practitioner

## 2021-12-26 ENCOUNTER — Telehealth: Payer: Self-pay | Admitting: Nurse Practitioner

## 2021-12-26 DIAGNOSIS — F988 Other specified behavioral and emotional disorders with onset usually occurring in childhood and adolescence: Secondary | ICD-10-CM

## 2021-12-26 MED ORDER — AMPHETAMINE-DEXTROAMPHETAMINE 5 MG PO TABS: 5.0000 mg | ORAL_TABLET | Freq: Two times a day (BID) | ORAL | 0 refills | Status: DC | PRN

## 2021-12-26 NOTE — Telephone Encounter (Signed)
Patient had requested her medication get sent to [facility] in [place] however they do not take her insurance any longer and she is asking if you can please send them to [facility] [place]? Please advise.  ?

## 2021-12-26 NOTE — Telephone Encounter (Signed)
Called pt she is advised that her Rx  was sent ?

## 2021-12-26 NOTE — Telephone Encounter (Signed)
Please let the patient know that I sent this medication to [facility] in [place].

## 2021-12-31 DIAGNOSIS — F988 Other specified behavioral and emotional disorders with onset usually occurring in childhood and adolescence: Secondary | ICD-10-CM | POA: Insufficient documentation

## 2021-12-31 DIAGNOSIS — E785 Hyperlipidemia, unspecified: Secondary | ICD-10-CM | POA: Insufficient documentation

## 2022-01-22 ENCOUNTER — Ambulatory Visit: Payer: 59 | Admitting: Nurse Practitioner

## 2022-02-04 NOTE — Progress Notes (Signed)
Established patient visit   Patient: Jaime Richardson   DOB: 1970/09/15   51 y.o. Female  MRN: [id number] Visit Date: 02/05/2022   Chief Complaint  Patient presents with   Follow-up   Subjective    HPI  Follow up visit.  -started trial adderall 5mg  up to twice daily as needed.  --reviewed PDMP profile. Overdose risk score is 190.  --fill history appropriate with no red flags.  --will need to sign controlled substances agreement.  -states that she has done well on this medication without negative side effects.  -Routine, fasting labs done prior to this visit. --Mild elevation of LDL and total cholesterol.   Medications: Outpatient Medications Prior to Visit  Medication Sig   [DISCONTINUED] amphetamine-dextroamphetamine (ADDERALL) 5 MG tablet Take 1 tablet (5 mg total) by mouth 2 (two) times daily as needed.   No facility-administered medications prior to visit.    Review of Systems  Constitutional:  Negative for activity change, appetite change, chills, fatigue and fever.  HENT:  Negative for congestion, postnasal drip, rhinorrhea, sinus pressure, sinus pain, sneezing and sore throat.   Eyes: Negative.   Respiratory:  Negative for cough, chest tightness, shortness of breath and wheezing.   Cardiovascular:  Negative for chest pain and palpitations.  Gastrointestinal:  Negative for abdominal pain, constipation, diarrhea, nausea and vomiting.  Endocrine: Negative for cold intolerance, heat intolerance, polydipsia and polyuria.  Genitourinary:  Negative for dyspareunia, dysuria, flank pain, frequency and urgency.  Musculoskeletal:  Negative for arthralgias, back pain and myalgias.  Skin:  Negative for rash.  Allergic/Immunologic: Negative for environmental allergies.  Neurological:  Negative for dizziness, weakness and headaches.  Hematological:  Negative for adenopathy.  Psychiatric/Behavioral:  Positive for decreased concentration. The patient is nervous/anxious.     Last  CBC Lab Results  Component Value Date   WBC 5.5 12/19/2021   HGB 14.3 12/19/2021   HCT 43.3 12/19/2021   MCV 85 12/19/2021   MCH 28.0 12/19/2021   RDW 12.2 12/19/2021   PLT 249 12/19/2021   Last metabolic panel Lab Results  Component Value Date   GLUCOSE 82 12/19/2021   NA 140 12/19/2021   K 4.4 12/19/2021   CL 102 12/19/2021   CO2 25 12/19/2021   BUN 19 12/19/2021   CREATININE 0.89 12/19/2021   EGFR 79 12/19/2021   CALCIUM 9.4 12/19/2021   PROT 6.8 12/19/2021   ALBUMIN 4.4 12/19/2021   LABGLOB 2.4 12/19/2021   AGRATIO 1.8 12/19/2021   BILITOT 0.8 12/19/2021   ALKPHOS 126 (H) 12/19/2021   AST 37 12/19/2021   ALT 15 12/19/2021   Last lipids Lab Results  Component Value Date   CHOL 204 (H) 12/19/2021   HDL 56 12/19/2021   LDLCALC 130 (H) 12/19/2021   TRIG 98 12/19/2021   CHOLHDL 3.6 12/19/2021   Last hemoglobin A1c Lab Results  Component Value Date   HGBA1C 5.6 12/19/2021   Last thyroid functions Lab Results  Component Value Date   TSH 2.060 12/19/2021   Last vitamin D Lab Results  Component Value Date   VD25OH 44.4 12/19/2021       Objective     Today's Vitals   02/05/22 1457  BP: 102/65  Pulse: 79  Temp: 98.1 F (36.7 C)  SpO2: 97%  Weight: 161 lb 3.2 oz (73.1 kg)  Height: 5' 4.57" (1.64 m)   Body mass index is 27.19 kg/m.   BP Readings from Last 3 Encounters:  02/05/22 102/65  12/25/21 117/71  12/11/21  107/65    Wt Readings from Last 3 Encounters:  02/05/22 161 lb 3.2 oz (73.1 kg)  12/25/21 161 lb (73 kg)  12/11/21 161 lb 3.2 oz (73.1 kg)    Physical Exam Vitals and nursing note reviewed.  Constitutional:      Appearance: Normal appearance. She is well-developed.  HENT:     Head: Normocephalic and atraumatic.     Nose: Nose normal.     Mouth/Throat:     Mouth: Mucous membranes are moist.     Pharynx: Oropharynx is clear.  Eyes:     Extraocular Movements: Extraocular movements intact.     Conjunctiva/sclera: Conjunctivae  normal.     Pupils: Pupils are equal, round, and reactive to light.  Cardiovascular:     Rate and Rhythm: Normal rate and regular rhythm.     Pulses: Normal pulses.     Heart sounds: Normal heart sounds.  Pulmonary:     Effort: Pulmonary effort is normal.     Breath sounds: Normal breath sounds.  Abdominal:     Palpations: Abdomen is soft.  Musculoskeletal:        General: Normal range of motion.     Cervical back: Normal range of motion and neck supple.  Lymphadenopathy:     Cervical: No cervical adenopathy.  Skin:    General: Skin is warm and dry.     Capillary Refill: Capillary refill takes less than 2 seconds.  Neurological:     General: No focal deficit present.     Mental Status: She is alert and oriented to person, place, and time.  Psychiatric:        Mood and Affect: Mood normal.        Behavior: Behavior normal.        Thought Content: Thought content normal.        Judgment: Judgment normal.       Assessment & Plan    1. Hyperlipidemia LDL goal <100 Recommend patient limit intake of fried and fatty foods. She should increase intake of lean proteins and green leafy vegetables. Adding exercise into daily routine will also be beneficial.    2. Body mass index 27.0-27.9, adult Discussed lowering calorie intake to 1500 calories per day and incorporating exercise into daily routine to help lose weight.   3. Attention deficit disorder (ADD) in adult Improved symptoms on very low-dose Adderall.  May continue 5 mg up to twice daily as needed for focus and concentration. Three.  New prescriptions were sent to her pharmacy.  Dates by 02/05/2022, 03/05/2022, and 04/03/2022.  She did sign a controlled substances agreement policy today, 02/05/2022. - amphetamine-dextroamphetamine (ADDERALL) 5 MG tablet; Take 1 tablet (5 mg total) by mouth 2 (two) times daily as needed.  Dispense: 60 tablet; Refill: 0   Problem List Items Addressed This Visit       Other   Body mass index  27.0-27.9, adult   Hyperlipidemia LDL goal <100 - Primary   Attention deficit disorder (ADD) in adult   Relevant Medications   amphetamine-dextroamphetamine (ADDERALL) 5 MG tablet     Return in about 3 months (around 05/08/2022) for ADD.         Carlean Jews, NP  [facility] at [facility] [phone] (phone) [phone] (fax)  [facility]

## 2022-02-05 ENCOUNTER — Encounter: Payer: Self-pay | Admitting: Nurse Practitioner

## 2022-02-05 ENCOUNTER — Ambulatory Visit (INDEPENDENT_AMBULATORY_CARE_PROVIDER_SITE_OTHER): Payer: 59 | Admitting: Nurse Practitioner

## 2022-02-05 VITALS — BP 102/65 | HR 79 | Temp 98.1°F | Ht 64.57 in | Wt 161.2 lb

## 2022-02-05 DIAGNOSIS — Z6827 Body mass index (BMI) 27.0-27.9, adult: Secondary | ICD-10-CM

## 2022-02-05 DIAGNOSIS — E785 Hyperlipidemia, unspecified: Secondary | ICD-10-CM

## 2022-02-05 DIAGNOSIS — F988 Other specified behavioral and emotional disorders with onset usually occurring in childhood and adolescence: Secondary | ICD-10-CM | POA: Diagnosis not present

## 2022-02-05 MED ORDER — AMPHETAMINE-DEXTROAMPHETAMINE 5 MG PO TABS
5.0000 mg | ORAL_TABLET | Freq: Two times a day (BID) | ORAL | 0 refills | Status: DC | PRN
Start: 2022-02-05 — End: 2022-05-08

## 2022-02-05 MED ORDER — AMPHETAMINE-DEXTROAMPHETAMINE 5 MG PO TABS: 5.0000 mg | ORAL_TABLET | Freq: Two times a day (BID) | ORAL | 0 refills | Status: DC | PRN

## 2022-05-08 ENCOUNTER — Encounter: Payer: Self-pay | Admitting: Nurse Practitioner

## 2022-05-08 ENCOUNTER — Ambulatory Visit (INDEPENDENT_AMBULATORY_CARE_PROVIDER_SITE_OTHER): Payer: 59 | Admitting: Nurse Practitioner

## 2022-05-08 DIAGNOSIS — F988 Other specified behavioral and emotional disorders with onset usually occurring in childhood and adolescence: Secondary | ICD-10-CM | POA: Diagnosis not present

## 2022-05-08 MED ORDER — AMPHETAMINE-DEXTROAMPHETAMINE 5 MG PO TABS: 5.0000 mg | ORAL_TABLET | Freq: Two times a day (BID) | ORAL | 0 refills | Status: DC | PRN

## 2022-05-08 NOTE — Progress Notes (Signed)
Established patient visit   Patient: Jaime Richardson   DOB: [birth year]   51 y.o. Female  MRN: [id number] Visit Date: 05/08/2022   Chief Complaint  Patient presents with   Follow-up   Subjective    HPI  Follow up visit -currently on adderall 5 mg twice daily as needed.  -PDMP reviewed 04/07/2022  -overdose risk score 190 with no red flags or state indicators  -last fill adderall 02/17/2022  -the patient reports doing well on this current dose without negative side effects.   Medications: Outpatient Medications Prior to Visit  Medication Sig   [DISCONTINUED] amphetamine-dextroamphetamine (ADDERALL) 5 MG tablet Take 1 tablet (5 mg total) by mouth 2 (two) times daily as needed.   No facility-administered medications prior to visit.    Review of Systems  Constitutional:  Positive for fatigue. Negative for activity change, appetite change, chills and fever.  HENT:  Negative for congestion, postnasal drip, rhinorrhea, sinus pressure, sinus pain, sneezing and sore throat.   Eyes: Negative.   Respiratory:  Negative for cough, chest tightness, shortness of breath and wheezing.   Cardiovascular:  Negative for chest pain and palpitations.  Gastrointestinal:  Negative for abdominal pain, constipation, diarrhea, nausea and vomiting.  Endocrine: Negative for cold intolerance, heat intolerance, polydipsia and polyuria.  Genitourinary:  Negative for dyspareunia, dysuria, flank pain, frequency and urgency.  Musculoskeletal:  Negative for arthralgias, back pain and myalgias.  Skin:  Negative for rash.  Allergic/Immunologic: Negative for environmental allergies.  Neurological:  Negative for dizziness, weakness and headaches.  Hematological:  Negative for adenopathy.  Psychiatric/Behavioral:  The patient is not nervous/anxious.     Last CBC Lab Results  Component Value Date   WBC 5.5 12/19/2021   HGB 14.3 12/19/2021   HCT 43.3 12/19/2021   MCV 85 12/19/2021   MCH 28.0 12/19/2021   RDW  12.2 12/19/2021   PLT 249 76/16/0737   Last metabolic panel Lab Results  Component Value Date   GLUCOSE 82 12/19/2021   NA 140 12/19/2021   K 4.4 12/19/2021   CL 102 12/19/2021   CO2 25 12/19/2021   BUN 19 12/19/2021   CREATININE 0.89 12/19/2021   EGFR 79 12/19/2021   CALCIUM 9.4 12/19/2021   PROT 6.8 12/19/2021   ALBUMIN 4.4 12/19/2021   LABGLOB 2.4 12/19/2021   AGRATIO 1.8 12/19/2021   BILITOT 0.8 12/19/2021   ALKPHOS 126 (H) 12/19/2021   AST 37 12/19/2021   ALT 15 12/19/2021   Last lipids Lab Results  Component Value Date   CHOL 204 (H) 12/19/2021   HDL 56 12/19/2021   LDLCALC 130 (H) 12/19/2021   TRIG 98 12/19/2021   CHOLHDL 3.6 12/19/2021   Last hemoglobin A1c Lab Results  Component Value Date   HGBA1C 5.6 12/19/2021   Last thyroid functions Lab Results  Component Value Date   TSH 2.060 12/19/2021   Last vitamin D Lab Results  Component Value Date   VD25OH 44.4 12/19/2021       Objective     Today's Vitals   05/08/22 1511  BP: 114/73  Pulse: 85  SpO2: 98%  Weight: 153 lb 1.9 oz (69.5 kg)  Height: 5' 4.57" (1.64 m)   Body mass index is 25.82 kg/m.   BP Readings from Last 3 Encounters:  05/08/22 114/73  02/05/22 102/65  12/25/21 117/71    Wt Readings from Last 3 Encounters:  05/08/22 153 lb 1.9 oz (69.5 kg)  02/05/22 161 lb 3.2 oz (73.1 kg)  12/25/21 161  lb (73 kg)    Physical Exam Vitals and nursing note reviewed.  Constitutional:      Appearance: Normal appearance. She is well-developed.  HENT:     Head: Normocephalic and atraumatic.     Nose: Nose normal.     Mouth/Throat:     Mouth: Mucous membranes are moist.     Pharynx: Oropharynx is clear.  Eyes:     Conjunctiva/sclera: Conjunctivae normal.     Pupils: Pupils are equal, round, and reactive to light.  Cardiovascular:     Rate and Rhythm: Normal rate and regular rhythm.     Pulses: Normal pulses.     Heart sounds: Normal heart sounds.  Pulmonary:     Effort:  Pulmonary effort is normal.     Breath sounds: Normal breath sounds.  Abdominal:     Palpations: Abdomen is soft.  Musculoskeletal:        General: Normal range of motion.     Cervical back: Normal range of motion and neck supple.  Lymphadenopathy:     Cervical: No cervical adenopathy.  Skin:    General: Skin is warm and dry.     Capillary Refill: Capillary refill takes less than 2 seconds.  Neurological:     General: No focal deficit present.     Mental Status: She is alert and oriented to person, place, and time.  Psychiatric:        Mood and Affect: Mood normal.        Behavior: Behavior normal.        Thought Content: Thought content normal.        Judgment: Judgment normal.       Assessment & Plan    1. Attention deficit disorder (ADD) in adult Doing well with current dose adderall. Three 30 day prescriptions sent to her pharmacy today. Dates are 05/08/2022, 06/05/2022, and 07/04/2022.  - amphetamine-dextroamphetamine (ADDERALL) 5 MG tablet; Take 1 tablet (5 mg total) by mouth 2 (two) times daily as needed.  Dispense: 60 tablet; Refill: 0   Problem List Items Addressed This Visit       Other   Attention deficit disorder (ADD) in adult   Relevant Medications   amphetamine-dextroamphetamine (ADDERALL) 5 MG tablet     Return in about 4 months (around 09/07/2022) for mood, ADD - please get pap and mammogram resuts from [facility]. ty .         Ronnell Freshwater, NP  [place] at [facility] [phone] (phone) [phone] (fax)  [place]

## 2022-07-06 ENCOUNTER — Other Ambulatory Visit: Payer: Self-pay | Admitting: Nurse Practitioner

## 2022-07-06 DIAGNOSIS — F988 Other specified behavioral and emotional disorders with onset usually occurring in childhood and adolescence: Secondary | ICD-10-CM

## 2022-07-10 MED ORDER — AMPHETAMINE-DEXTROAMPHETAMINE 5 MG PO TABS: 5.0000 mg | ORAL_TABLET | Freq: Two times a day (BID) | ORAL | 0 refills | Status: DC | PRN

## 2022-07-10 NOTE — Telephone Encounter (Signed)
Pt calling requesting a refill before the holiday.  Please advise

## 2022-07-10 NOTE — Telephone Encounter (Signed)
I sent a new prescription for this just now, but I'm not sure why they sent this. She has one that says can be filled after 07/04/2022. That is written in the note to the pharmacy in request they sent.

## 2022-09-11 ENCOUNTER — Ambulatory Visit (INDEPENDENT_AMBULATORY_CARE_PROVIDER_SITE_OTHER): Payer: 59 | Admitting: Nurse Practitioner

## 2022-09-11 ENCOUNTER — Encounter: Payer: Self-pay | Admitting: Nurse Practitioner

## 2022-09-11 VITALS — BP 114/75 | HR 77 | Ht 64.57 in | Wt 154.0 lb

## 2022-09-11 DIAGNOSIS — F988 Other specified behavioral and emotional disorders with onset usually occurring in childhood and adolescence: Secondary | ICD-10-CM

## 2022-09-11 DIAGNOSIS — Z23 Encounter for immunization: Secondary | ICD-10-CM | POA: Diagnosis not present

## 2022-09-11 MED ORDER — AMPHETAMINE-DEXTROAMPHETAMINE 5 MG PO TABS: 5.0000 mg | ORAL_TABLET | Freq: Two times a day (BID) | ORAL | 0 refills | Status: DC | PRN

## 2022-09-11 NOTE — Progress Notes (Signed)
Established patient visit   Patient: Jaime Richardson   DOB: [birth year]   52 y.o. Female  MRN: [id number] Visit Date: 09/11/2022   Chief Complaint  Patient presents with   Follow-up   Subjective    HPI  Follow up  -ADD -currently taking adderall 5 mg twice daily as needed  --pdmp profile reviewed today  --risk score for unintentional overdose is average at 260  --there are no red flags or state indicators  --fill history is appropriate  -the patient states that she would like to get her first shingles vaccine today     Medications: Outpatient Medications Prior to Visit  Medication Sig   [DISCONTINUED] amphetamine-dextroamphetamine (ADDERALL) 5 MG tablet Take 1 tablet (5 mg total) by mouth 2 (two) times daily as needed.   No facility-administered medications prior to visit.    Review of Systems  Constitutional:  Positive for fatigue. Negative for activity change, appetite change, chills and fever.  HENT:  Negative for congestion, postnasal drip, rhinorrhea, sinus pressure, sinus pain, sneezing and sore throat.   Eyes: Negative.   Respiratory:  Negative for cough, chest tightness, shortness of breath and wheezing.   Cardiovascular:  Negative for chest pain and palpitations.  Gastrointestinal:  Negative for abdominal pain, constipation, diarrhea, nausea and vomiting.  Endocrine: Negative for cold intolerance, heat intolerance, polydipsia and polyuria.  Genitourinary:  Negative for dyspareunia, dysuria, flank pain, frequency and urgency.  Musculoskeletal:  Negative for arthralgias, back pain and myalgias.  Skin:  Negative for rash.  Allergic/Immunologic: Negative for environmental allergies.  Neurological:  Negative for dizziness, weakness and headaches.  Hematological:  Negative for adenopathy.  Psychiatric/Behavioral:  Positive for decreased concentration and dysphoric mood. The patient is not nervous/anxious.        Objective     Today's Vitals   09/11/22 1514   BP: 114/75  Pulse: 77  SpO2: 99%  Weight: 154 lb (69.9 kg)  Height: 5' 4.57" (1.64 m)   Body mass index is 25.97 kg/m.  BP Readings from Last 3 Encounters:  09/11/22 114/75  05/08/22 114/73  02/05/22 102/65    Wt Readings from Last 3 Encounters:  09/11/22 154 lb (69.9 kg)  05/08/22 153 lb 1.9 oz (69.5 kg)  02/05/22 161 lb 3.2 oz (73.1 kg)    Physical Exam Vitals and nursing note reviewed.  Constitutional:      Appearance: Normal appearance. She is well-developed.  HENT:     Head: Normocephalic and atraumatic.     Nose: Nose normal.  Eyes:     Extraocular Movements: Extraocular movements intact.     Conjunctiva/sclera: Conjunctivae normal.     Pupils: Pupils are equal, round, and reactive to light.  Cardiovascular:     Rate and Rhythm: Normal rate and regular rhythm.     Pulses: Normal pulses.     Heart sounds: Normal heart sounds.  Pulmonary:     Effort: Pulmonary effort is normal.     Breath sounds: Normal breath sounds.  Abdominal:     Palpations: Abdomen is soft.  Musculoskeletal:        General: Normal range of motion.     Cervical back: Normal range of motion and neck supple.  Lymphadenopathy:     Cervical: No cervical adenopathy.  Skin:    General: Skin is warm and dry.     Capillary Refill: Capillary refill takes less than 2 seconds.  Neurological:     General: No focal deficit present.     Mental Status:  She is alert and oriented to person, place, and time.  Psychiatric:        Mood and Affect: Mood normal.        Behavior: Behavior normal.        Thought Content: Thought content normal.        Judgment: Judgment normal.      Assessment & Plan    1. Attention deficit disorder (ADD) in adult Doing well. May continue adderall 5 mg twice daily as needed for focus and concentration. Three 30 day prescriptions sent to her pharmacy. Dates are 09/11/2022, 10/10/2022, and 11/07/2022.  - amphetamine-dextroamphetamine (ADDERALL) 5 MG tablet; Take 1 tablet (5  mg total) by mouth 2 (two) times daily as needed.  Dispense: 60 tablet; Refill: 0  2. Need for shingles vaccine Initial shingles vaccine administered during today's visit,  - Varicella-zoster vaccine IM   Problem List Items Addressed This Visit       Other   Attention deficit disorder (ADD) in adult - Primary   Relevant Medications   amphetamine-dextroamphetamine (ADDERALL) 5 MG tablet   Other Visit Diagnoses     Need for shingles vaccine       Relevant Orders   Varicella-zoster vaccine IM (Completed)        Return in about 4 months (around 01/10/2023) for health maintenance exam, FBW at time of visit, AM please - 2nd shingles vaccine .         Ronnell Freshwater, NP  [facility] at [facility] [phone] (phone) [phone] (fax)  [place]

## 2022-09-13 ENCOUNTER — Encounter: Payer: Self-pay | Admitting: Nurse Practitioner

## 2022-12-17 ENCOUNTER — Encounter: Payer: Self-pay | Admitting: Nurse Practitioner

## 2023-01-15 ENCOUNTER — Encounter: Payer: Self-pay | Admitting: Nurse Practitioner

## 2023-01-15 ENCOUNTER — Ambulatory Visit (INDEPENDENT_AMBULATORY_CARE_PROVIDER_SITE_OTHER): Payer: 59 | Admitting: Nurse Practitioner

## 2023-01-15 VITALS — BP 108/73 | HR 76 | Ht 64.57 in | Wt 151.4 lb

## 2023-01-15 DIAGNOSIS — Z23 Encounter for immunization: Secondary | ICD-10-CM

## 2023-01-15 DIAGNOSIS — F988 Other specified behavioral and emotional disorders with onset usually occurring in childhood and adolescence: Secondary | ICD-10-CM | POA: Diagnosis not present

## 2023-01-15 MED ORDER — AMPHETAMINE-DEXTROAMPHETAMINE 5 MG PO TABS
5.0000 mg | ORAL_TABLET | Freq: Two times a day (BID) | ORAL | 0 refills | Status: AC | PRN
Start: 2023-01-15 — End: ?

## 2023-01-15 MED ORDER — AMPHETAMINE-DEXTROAMPHETAMINE 5 MG PO TABS: 5.0000 mg | ORAL_TABLET | Freq: Two times a day (BID) | ORAL | 0 refills | Status: DC | PRN

## 2023-01-15 NOTE — Progress Notes (Signed)
Established patient visit   Patient: Jaime Richardson   DOB: 10/02/1970   51 y.o. Female  MRN: [id number] Visit Date: 01/15/2023   Chief Complaint  Patient presents with   Medical Management of Chronic Issues   2nd shingle    Subjective    HPI  Follow up  -ADD -currently taking adderall 5 mg twice daily as needed  --states that she takes this mostly once daily  --no negative side effects  --states that it helps her to stay on track and focused while at work.  --pdmp profile reviewed today  --risk score for unintentional overdose is average at 260  --there are no red flags or state indicators  --fill history is appropriate  -the patient states that she would like to get her second shingles vaccine today  --did well with first shingles vaccine   -Shdenies chest pain, chest pressure, or shortness of breath. She denies headaches or visual disturbances. She denies abdominal pain, nausea, vomiting, or changes in bowel or bladder habits.     Medications: Outpatient Medications Prior to Visit  Medication Sig   [DISCONTINUED] amphetamine-dextroamphetamine (ADDERALL) 5 MG tablet Take 1 tablet (5 mg total) by mouth 2 (two) times daily as needed.   No facility-administered medications prior to visit.    Review of Systems See HPI     Last CBC Lab Results  Component Value Date   WBC 5.5 12/19/2021   HGB 14.3 12/19/2021   HCT 43.3 12/19/2021   MCV 85 12/19/2021   MCH 28.0 12/19/2021   RDW 12.2 12/19/2021   PLT 249 12/19/2021   Last metabolic panel Lab Results  Component Value Date   GLUCOSE 82 12/19/2021   NA 140 12/19/2021   K 4.4 12/19/2021   CL 102 12/19/2021   CO2 25 12/19/2021   BUN 19 12/19/2021   CREATININE 0.89 12/19/2021   EGFR 79 12/19/2021   CALCIUM 9.4 12/19/2021   PROT 6.8 12/19/2021   ALBUMIN 4.4 12/19/2021   LABGLOB 2.4 12/19/2021   AGRATIO 1.8 12/19/2021   BILITOT 0.8 12/19/2021   ALKPHOS 126 (H) 12/19/2021   AST 37 12/19/2021   ALT 15  12/19/2021   Last lipids Lab Results  Component Value Date   CHOL 204 (H) 12/19/2021   HDL 56 12/19/2021   LDLCALC 130 (H) 12/19/2021   TRIG 98 12/19/2021   CHOLHDL 3.6 12/19/2021   Last hemoglobin A1c Lab Results  Component Value Date   HGBA1C 5.6 12/19/2021   Last thyroid functions Lab Results  Component Value Date   TSH 2.060 12/19/2021   Last vitamin D Lab Results  Component Value Date   VD25OH 44.4 12/19/2021       Objective     Today's Vitals   01/15/23 1527  BP: 108/73  Pulse: 76  SpO2: 98%  Weight: 151 lb 6.4 oz (68.7 kg)  Height: 5' 4.57" (1.64 m)   Body mass index is 25.53 kg/m.  BP Readings from Last 3 Encounters:  01/15/23 108/73  09/11/22 114/75  05/08/22 114/73    Wt Readings from Last 3 Encounters:  01/15/23 151 lb 6.4 oz (68.7 kg)  09/11/22 154 lb (69.9 kg)  05/08/22 153 lb 1.9 oz (69.5 kg)    Physical Exam Vitals and nursing note reviewed.  Constitutional:      Appearance: Normal appearance. She is well-developed.  HENT:     Head: Normocephalic and atraumatic.     Nose: Nose normal.     Mouth/Throat:  Mouth: Mucous membranes are moist.     Pharynx: Oropharynx is clear.  Eyes:     Extraocular Movements: Extraocular movements intact.     Conjunctiva/sclera: Conjunctivae normal.     Pupils: Pupils are equal, round, and reactive to light.  Neck:     Vascular: No carotid bruit.  Cardiovascular:     Rate and Rhythm: Normal rate and regular rhythm.     Pulses: Normal pulses.     Heart sounds: Normal heart sounds.  Pulmonary:     Effort: Pulmonary effort is normal.     Breath sounds: Normal breath sounds.  Abdominal:     Palpations: Abdomen is soft.  Musculoskeletal:        General: Normal range of motion.     Cervical back: Normal range of motion and neck supple.  Lymphadenopathy:     Cervical: No cervical adenopathy.  Skin:    General: Skin is warm and dry.     Capillary Refill: Capillary refill takes less than 2  seconds.  Neurological:     General: No focal deficit present.     Mental Status: She is alert and oriented to person, place, and time.  Psychiatric:        Mood and Affect: Mood normal.        Behavior: Behavior normal.        Thought Content: Thought content normal.        Judgment: Judgment normal.      Assessment & Plan    Attention deficit disorder (ADD) in adult Assessment & Plan: Stable.  Pdmp profile reviewed today.  Accidental overdose risk score is 260 No red flags or state indicators.  Fill history appropriate.  Three 30 day prescriptions were sent to her pharmacy   Orders: -     Amphetamine-Dextroamphetamine; Take 1 tablet (5 mg total) by mouth 2 (two) times daily as needed.  Dispense: 60 tablet; Refill: 0  Need for shingles vaccine -     Varicella-zoster vaccine IM     Return in about 3 months (around 04/17/2023) for ADD.         Carlean Jews, NP  [facility] at [facility] [phone] (phone) [phone] (fax)  [facility]

## 2023-01-15 NOTE — Assessment & Plan Note (Signed)
Stable.  Pdmp profile reviewed today.  Accidental overdose risk score is 260 No red flags or state indicators.  Fill history appropriate.  Three 30 day prescriptions were sent to her pharmacy

## 2023-01-17 ENCOUNTER — Encounter: Payer: Self-pay | Admitting: Nurse Practitioner

## 2023-03-28 ENCOUNTER — Encounter: Payer: 59 | Admitting: Nurse Practitioner

## 2023-05-14 ENCOUNTER — Other Ambulatory Visit: Payer: 59

## 2023-05-14 DIAGNOSIS — R5383 Other fatigue: Secondary | ICD-10-CM

## 2023-05-14 DIAGNOSIS — Z0000 Encounter for general adult medical examination without abnormal findings: Secondary | ICD-10-CM

## 2023-05-14 DIAGNOSIS — E785 Hyperlipidemia, unspecified: Secondary | ICD-10-CM

## 2023-05-15 LAB — CBC WITH DIFFERENTIAL/PLATELET
Basophils Absolute: 0 10*3/uL (ref 0.0–0.2)
Basos: 1 %
EOS (ABSOLUTE): 0.1 10*3/uL (ref 0.0–0.4)
Eos: 2 %
Hematocrit: 44 % (ref 34.0–46.6)
Hemoglobin: 14.4 g/dL (ref 11.1–15.9)
Immature Grans (Abs): 0 10*3/uL (ref 0.0–0.1)
Immature Granulocytes: 0 %
Lymphocytes Absolute: 1.5 10*3/uL (ref 0.7–3.1)
Lymphs: 27 %
MCH: 28.5 pg (ref 26.6–33.0)
MCHC: 32.7 g/dL (ref 31.5–35.7)
MCV: 87 fL (ref 79–97)
Monocytes Absolute: 0.5 10*3/uL (ref 0.1–0.9)
Monocytes: 9 %
Neutrophils Absolute: 3.3 10*3/uL (ref 1.4–7.0)
Neutrophils: 61 %
Platelets: 304 10*3/uL (ref 150–450)
RBC: 5.06 x10E6/uL (ref 3.77–5.28)
RDW: 12.6 % (ref 11.7–15.4)
WBC: 5.4 10*3/uL (ref 3.4–10.8)

## 2023-05-15 LAB — COMPREHENSIVE METABOLIC PANEL
ALT: 17 IU/L (ref 0–32)
AST: 31 IU/L (ref 0–40)
Albumin: 4.3 g/dL (ref 3.8–4.9)
Alkaline Phosphatase: 131 IU/L — ABNORMAL HIGH (ref 44–121)
BUN/Creatinine Ratio: 22 (ref 9–23)
BUN: 20 mg/dL (ref 6–24)
Bilirubin Total: 1.2 mg/dL (ref 0.0–1.2)
CO2: 25 mmol/L (ref 20–29)
Calcium: 9.8 mg/dL (ref 8.7–10.2)
Chloride: 101 mmol/L (ref 96–106)
Creatinine, Ser: 0.89 mg/dL (ref 0.57–1.00)
Globulin, Total: 2.4 g/dL (ref 1.5–4.5)
Glucose: 85 mg/dL (ref 70–99)
Potassium: 4.4 mmol/L (ref 3.5–5.2)
Sodium: 140 mmol/L (ref 134–144)
Total Protein: 6.7 g/dL (ref 6.0–8.5)
eGFR: 78 mL/min/{1.73_m2} (ref >59)

## 2023-05-15 LAB — HEMOGLOBIN A1C
Est. average glucose Bld gHb Est-mCnc: 114 mg/dL
Hgb A1c MFr Bld: 5.6 % (ref 4.8–5.6)

## 2023-05-15 LAB — LIPID PANEL
Chol/HDL Ratio: 3.7 ratio (ref 0.0–4.4)
Cholesterol, Total: 221 mg/dL — ABNORMAL HIGH (ref 100–199)
HDL: 59 mg/dL (ref >39)
LDL Chol Calc (NIH): 141 mg/dL — ABNORMAL HIGH (ref 0–99)
Triglycerides: 119 mg/dL (ref 0–149)
VLDL Cholesterol Cal: 21 mg/dL (ref 5–40)

## 2023-05-15 LAB — TSH: TSH: 1.24 u[IU]/mL (ref 0.450–4.500)

## 2023-05-21 ENCOUNTER — Ambulatory Visit (INDEPENDENT_AMBULATORY_CARE_PROVIDER_SITE_OTHER): Payer: 59 | Admitting: Family Medicine

## 2023-05-21 ENCOUNTER — Encounter: Payer: Self-pay | Admitting: Family Medicine

## 2023-05-21 VITALS — BP 120/80 | HR 74 | Resp 18 | Ht 64.57 in | Wt 149.0 lb

## 2023-05-21 DIAGNOSIS — F988 Other specified behavioral and emotional disorders with onset usually occurring in childhood and adolescence: Secondary | ICD-10-CM

## 2023-05-21 DIAGNOSIS — Z1211 Encounter for screening for malignant neoplasm of colon: Secondary | ICD-10-CM | POA: Diagnosis not present

## 2023-05-21 DIAGNOSIS — E559 Vitamin D deficiency, unspecified: Secondary | ICD-10-CM

## 2023-05-21 DIAGNOSIS — Z0000 Encounter for general adult medical examination without abnormal findings: Secondary | ICD-10-CM

## 2023-05-21 DIAGNOSIS — Z1212 Encounter for screening for malignant neoplasm of rectum: Secondary | ICD-10-CM

## 2023-05-21 DIAGNOSIS — E785 Hyperlipidemia, unspecified: Secondary | ICD-10-CM | POA: Diagnosis not present

## 2023-05-21 MED ORDER — AMPHETAMINE-DEXTROAMPHETAMINE 5 MG PO TABS: 5.0000 mg | ORAL_TABLET | Freq: Two times a day (BID) | ORAL | 0 refills | Status: DC | PRN

## 2023-05-21 NOTE — Assessment & Plan Note (Signed)
Stable, well-controlled.  Continue Adderall 5 mg up to 2 times daily as needed.  PDMP reviewed, no aberrancies.

## 2023-05-21 NOTE — Progress Notes (Signed)
Complete physical exam  Patient: Jaime Richardson   DOB: 1970-08-25   52 y.o. Female  MRN: [id number]  Subjective:    Chief Complaint  Patient presents with   Annual Exam    Jaime Richardson is a 52 y.o. female who presents today for a complete physical exam. She reports consuming a general diet.  She has phases where she gets into an exercise routine and then falls out of it, but she would like to be more consistent.  She generally feels well. She reports sleeping fairly well though hot flashes do make it difficult to sleep sometimes. She does not have additional problems to discuss today.    Most recent fall risk assessment:    09/11/2022    3:21 PM  Fall Risk   Falls in the past year? 0  Number falls in past yr: 0  Injury with Fall? 0  Follow up Falls evaluation completed     Most recent depression and anxiety screenings:    05/21/2023    9:03 AM 01/15/2023    3:29 PM  PHQ 2/9 Scores  PHQ - 2 Score 0 0  PHQ- 9 Score 1 2      09/11/2022    3:21 PM 05/08/2022    3:16 PM 02/05/2022    2:59 PM 12/25/2021    2:43 PM  GAD 7 : Generalized Anxiety Score  Nervous, Anxious, on Edge 0 0 0 0  Control/stop worrying 0 0 0 0  Worry too much - different things 0 0 0 0  Trouble relaxing 0 0 0 0  Restless 0 0 0 0  Easily annoyed or irritable 0 0 0 0  Afraid - awful might happen 0 0 0 0  Total GAD 7 Score 0 0 0 0    Patient Active Problem List   Diagnosis Date Noted   Hyperlipidemia LDL goal <100 12/31/2021   Attention deficit disorder (ADD) in adult 12/31/2021   Body mass index 27.0-27.9, adult 12/11/2021   Family history of coronary artery disease 12/11/2021   Other fatigue 12/11/2021    Past Surgical History:  Procedure Laterality Date   ABDOMINAL HYSTERECTOMY  2015   APPENDECTOMY  Do not remember   CHOLECYSTECTOMY  Do not remember   EYE SURGERY  Do not remember   Lasix   LAPAROSCOPIC TOTAL HYSTERECTOMY  2011   TUBAL LIGATION  2000   Social History   Tobacco Use    Smoking status: Never    Passive exposure: Never   Smokeless tobacco: Never   Tobacco comments:    Do not smoke  Substance Use Topics   Alcohol use: Yes    Comment: Occasionally   Drug use: Never   Family History  Problem Relation Age of Onset   Heart attack Mother    Leukemia Mother    Cancer Mother    Heart disease Mother    Heart attack Father    Alcoholism Father    Alcohol abuse Father    Heart disease Father    Stroke Maternal Grandmother    Heart attack Maternal Grandmother    Breast cancer Maternal Grandmother    Cancer Maternal Grandmother    Heart disease Maternal Grandmother    Heart attack Maternal Grandfather    Alcoholism Maternal Grandfather    Heart disease Paternal Grandfather    Obesity Paternal Grandfather    Cancer Paternal Grandmother    Obesity Paternal Grandmother    ADD / ADHD Son  Heart disease Maternal Aunt    Heart disease Maternal Uncle    Allergies  Allergen Reactions   Hydromorphone Anaphylaxis   Ciprofloxacin      Patient Care Team: Melida Quitter, PA as PCP - General (Family Medicine) [facility]   Outpatient Medications Prior to Visit  Medication Sig   [DISCONTINUED] amphetamine-dextroamphetamine (ADDERALL) 5 MG tablet Take 1 tablet (5 mg total) by mouth 2 (two) times daily as needed.   No facility-administered medications prior to visit.    Review of Systems  Constitutional:  Negative for chills, fever and malaise/fatigue.  HENT:  Negative for congestion and hearing loss.   Eyes:  Negative for blurred vision and double vision.  Respiratory:  Negative for cough and shortness of breath.   Cardiovascular:  Negative for chest pain, palpitations and leg swelling.  Gastrointestinal:  Negative for abdominal pain, constipation, diarrhea and heartburn.  Genitourinary:  Negative for frequency and urgency.  Musculoskeletal:  Negative for myalgias and neck pain.  Neurological:  Negative for headaches.   Endo/Heme/Allergies:  Negative for polydipsia.  Psychiatric/Behavioral:  Negative for depression. The patient has insomnia (Due to hot flashes). The patient is not nervous/anxious.       Objective:    BP 120/80 (BP Location: Left Arm, Patient Position: Sitting, Cuff Size: Normal)   Pulse 74   Resp 18   Ht 5' 4.57" (1.64 m)   Wt 149 lb (67.6 kg)   SpO2 99%   BMI 25.13 kg/m    Physical Exam Constitutional:      General: She is not in acute distress.    Appearance: Normal appearance.  HENT:     Head: Normocephalic and atraumatic.     Right Ear: Tympanic membrane, ear canal and external ear normal. There is no impacted cerumen.     Left Ear: Tympanic membrane, ear canal and external ear normal. There is no impacted cerumen.     Nose: Nose normal. No congestion or rhinorrhea.     Mouth/Throat:     Mouth: Mucous membranes are moist.     Pharynx: No oropharyngeal exudate or posterior oropharyngeal erythema.  Eyes:     Extraocular Movements: Extraocular movements intact.     Conjunctiva/sclera: Conjunctivae normal.     Pupils: Pupils are equal, round, and reactive to light.     Comments: Does not wear glasses or contacts regularly  Neck:     Thyroid: No thyroid mass, thyromegaly or thyroid tenderness.  Cardiovascular:     Rate and Rhythm: Normal rate and regular rhythm.     Heart sounds: Normal heart sounds. No murmur heard.    No friction rub. No gallop.  Pulmonary:     Effort: Pulmonary effort is normal. No respiratory distress.     Breath sounds: Normal breath sounds. No wheezing, rhonchi or rales.  Abdominal:     General: Abdomen is flat. Bowel sounds are normal. There is no distension.     Palpations: There is no mass.     Tenderness: There is no abdominal tenderness. There is no guarding.  Musculoskeletal:        General: Normal range of motion.     Cervical back: Normal range of motion and neck supple.  Lymphadenopathy:     Cervical: No cervical adenopathy.   Skin:    General: Skin is warm and dry.  Neurological:     Mental Status: She is alert and oriented to person, place, and time.     Cranial Nerves:  No cranial nerve deficit.     Motor: No weakness.     Deep Tendon Reflexes: Reflexes normal.  Psychiatric:        Mood and Affect: Mood normal.       Assessment & Plan:    Routine Health Maintenance and Physical Exam  Immunization History  Administered Date(s) Administered   Zoster Recombinant(Shingrix) 09/11/2022, 01/15/2023    Health Maintenance  Topic Date Due   HIV Screening  Never done   Hepatitis C Screening  Never done   DTaP/Tdap/Td (1 - Tdap) Never done   Colonoscopy  Never done   MAMMOGRAM  09/13/2022   COVID-19 Vaccine (1 - 2023-24 season) 06/06/2023 (Originally 04/21/2023)   INFLUENZA VACCINE  11/18/2023 (Originally 03/21/2023)   Zoster Vaccines- Shingrix  Completed   HPV VACCINES  Aged Out    Reviewed most recent labs including CBC, CMP, lipid panel, A1C, TSH, and vitamin D. All within normal limits/stable from last check other than LDL elevated 141, alkaline phosphatase elevated 131.  Encouraged to maintain vitamin D levels with over-the-counter vitamin D3 2000 units daily supplement and limit trans and saturated fats.  Repeat CBC, CMP, lipid panel, vitamin D in 6 months. Agreeable to Cologuard for colorectal cancer screening today. She is aware that she is due for mammogram and plans to call to schedule. Tetanus was updated at an urgent care in [place], requesting copy of records.  Discussed health benefits of physical activity, and encouraged her to engage in regular exercise appropriate for her age and condition.  Wellness examination  Attention deficit disorder (ADD) in adult Assessment & Plan: Stable, well-controlled.  Continue Adderall 5 mg up to 2 times daily as needed.  PDMP reviewed, no aberrancies.  Orders: -     Amphetamine-Dextroamphetamine; Take 1 tablet (5 mg total) by mouth 2 (two) times daily as  needed.  Dispense: 60 tablet; Refill: 0 -     Amphetamine-Dextroamphetamine; Take 1 tablet (5 mg total) by mouth 2 (two) times daily as needed.  Dispense: 60 tablet; Refill: 0 -     Amphetamine-Dextroamphetamine; Take 1 tablet (5 mg total) by mouth 2 (two) times daily as needed.  Dispense: 60 tablet; Refill: 0  Hyperlipidemia LDL goal <100 Assessment & Plan: Last lipid panel: LDL 141, HDL 59, triglycerides 119.  The 10-year ASCVD risk score (Arnett DK, et al., 2019) is: 1%.  She does have a family history of coronary artery disease, so we will continue to monitor closely and have ongoing conversations about management.   Screening for colorectal cancer -     Cologuard    Return in about 3 months (around 08/21/2023) for follow-up for ADHD, in person or video.     Melida Quitter, PA

## 2023-05-21 NOTE — Assessment & Plan Note (Addendum)
Last lipid panel: LDL 141, HDL 59, triglycerides 119.  The 10-year ASCVD risk score (Arnett DK, et al., 2019) is: 1%.  She does have a family history of coronary artery disease, so we will continue to monitor closely and have ongoing conversations about management.

## 2023-06-12 LAB — COLOGUARD: COLOGUARD: NEGATIVE

## 2023-08-22 ENCOUNTER — Telehealth: Payer: 59 | Admitting: Family Medicine

## 2023-08-22 ENCOUNTER — Encounter: Payer: Self-pay | Admitting: Family Medicine

## 2023-08-22 DIAGNOSIS — F988 Other specified behavioral and emotional disorders with onset usually occurring in childhood and adolescence: Secondary | ICD-10-CM

## 2023-08-22 DIAGNOSIS — E785 Hyperlipidemia, unspecified: Secondary | ICD-10-CM

## 2023-08-22 DIAGNOSIS — Z1159 Encounter for screening for other viral diseases: Secondary | ICD-10-CM

## 2023-08-22 MED ORDER — AMPHETAMINE-DEXTROAMPHETAMINE 5 MG PO TABS: 5.0000 mg | ORAL_TABLET | Freq: Two times a day (BID) | ORAL | 0 refills | Status: DC | PRN

## 2023-08-22 NOTE — Assessment & Plan Note (Addendum)
 Stable, well-controlled.  Continue Adderall 5 mg up to 2 times daily as needed.  PDMP reviewed, no aberrancies.  Overdose risk score 260.

## 2023-08-22 NOTE — Progress Notes (Signed)
   Virtual Visit via Video Note  I connected with Jaime Richardson on 08/22/23 at  3:10 PM EST by a video enabled telemedicine application and verified that I am speaking with the correct person using two identifiers.  Patient Location: Home Provider Location: Office/clinic  I discussed the limitations, risks, security, and privacy concerns of performing an evaluation and management service by video and the availability of in person appointments. I also discussed with the patient that there may be a patient responsible charge related to this service. The patient expressed understanding and agreed to proceed.    Subjective   Patient ID: Jaime Richardson, female    DOB: 1970-09-08  Age: 53 y.o. MRN: [id number]  Chief Complaint  Patient presents with   Follow-up    HPI Jaime Richardson is a 53 y.o. female presenting today for follow up of ADHD. Reports symptoms are well controlled on Adderall. Denies any problems with insomnia, chest pain, palpitations, or SOB.     ROS Negative unless otherwise noted in HPI  Outpatient Medications Prior to Visit  Medication Sig   amphetamine -dextroamphetamine  (ADDERALL) 5 MG tablet Take 1 tablet (5 mg total) by mouth 2 (two) times daily as needed.   [DISCONTINUED] amphetamine -dextroamphetamine  (ADDERALL) 5 MG tablet Take 1 tablet (5 mg total) by mouth 2 (two) times daily as needed.   [DISCONTINUED] amphetamine -dextroamphetamine  (ADDERALL) 5 MG tablet Take 1 tablet (5 mg total) by mouth 2 (two) times daily as needed.   No facility-administered medications prior to visit.     Objective:     Physical Exam General: Speaking clearly in complete sentences without any shortness of breath.  Alert and oriented x3.  Normal judgment. No apparent acute distress.   Assessment & Plan:  Attention deficit disorder (ADD) in adult Assessment & Plan: Stable, well-controlled.  Continue Adderall 5 mg up to 2 times daily as needed.  PDMP reviewed, no  aberrancies.     Return in about 3 months (around 11/20/2023) for follow-up for ADHD, HLD, fasting blood work 1 week before.   I discussed the assessment and treatment plan with the patient. The patient was provided an opportunity to ask questions, and all were answered. The patient agreed with the plan and demonstrated an understanding of the instructions.   The patient was advised to call back or seek an in-person evaluation if the symptoms worsen or if the condition fails to improve as anticipated.  The above assessment and management plan was discussed with the patient. The patient verbalized understanding of and has agreed to the management plan.   Joesph DELENA Sear, PA

## 2023-09-26 ENCOUNTER — Encounter: Payer: Self-pay | Admitting: Family Medicine

## 2023-12-09 ENCOUNTER — Other Ambulatory Visit: Payer: 59

## 2023-12-09 DIAGNOSIS — E559 Vitamin D deficiency, unspecified: Secondary | ICD-10-CM

## 2023-12-09 DIAGNOSIS — Z1159 Encounter for screening for other viral diseases: Secondary | ICD-10-CM

## 2023-12-09 DIAGNOSIS — E785 Hyperlipidemia, unspecified: Secondary | ICD-10-CM

## 2023-12-09 DIAGNOSIS — F988 Other specified behavioral and emotional disorders with onset usually occurring in childhood and adolescence: Secondary | ICD-10-CM

## 2023-12-10 ENCOUNTER — Ambulatory Visit: Payer: 59 | Admitting: Family Medicine

## 2023-12-10 ENCOUNTER — Ambulatory Visit (INDEPENDENT_AMBULATORY_CARE_PROVIDER_SITE_OTHER): Payer: 59 | Admitting: Family Medicine

## 2023-12-10 ENCOUNTER — Encounter: Payer: Self-pay | Admitting: Family Medicine

## 2023-12-10 VITALS — BP 123/82 | HR 70 | Ht 64.57 in | Wt 153.1 lb

## 2023-12-10 DIAGNOSIS — R7401 Elevation of levels of liver transaminase levels: Secondary | ICD-10-CM | POA: Insufficient documentation

## 2023-12-10 DIAGNOSIS — F988 Other specified behavioral and emotional disorders with onset usually occurring in childhood and adolescence: Secondary | ICD-10-CM | POA: Diagnosis not present

## 2023-12-10 DIAGNOSIS — E785 Hyperlipidemia, unspecified: Secondary | ICD-10-CM | POA: Diagnosis not present

## 2023-12-10 LAB — VITAMIN D 25 HYDROXY (VIT D DEFICIENCY, FRACTURES): Vit D, 25-Hydroxy: 90.8 ng/mL (ref 30.0–100.0)

## 2023-12-10 LAB — CBC WITH DIFFERENTIAL/PLATELET
Basophils Absolute: 0.1 10*3/uL (ref 0.0–0.2)
Basos: 1 %
EOS (ABSOLUTE): 0.2 10*3/uL (ref 0.0–0.4)
Eos: 3 %
Hematocrit: 44.6 % (ref 34.0–46.6)
Hemoglobin: 14.1 g/dL (ref 11.1–15.9)
Immature Grans (Abs): 0 10*3/uL (ref 0.0–0.1)
Immature Granulocytes: 0 %
Lymphocytes Absolute: 1.7 10*3/uL (ref 0.7–3.1)
Lymphs: 31 %
MCH: 27.7 pg (ref 26.6–33.0)
MCHC: 31.6 g/dL (ref 31.5–35.7)
MCV: 88 fL (ref 79–97)
Monocytes Absolute: 0.5 10*3/uL (ref 0.1–0.9)
Monocytes: 9 %
Neutrophils Absolute: 3.1 10*3/uL (ref 1.4–7.0)
Neutrophils: 56 %
Platelets: 294 10*3/uL (ref 150–450)
RBC: 5.09 x10E6/uL (ref 3.77–5.28)
RDW: 12.4 % (ref 11.7–15.4)
WBC: 5.6 10*3/uL (ref 3.4–10.8)

## 2023-12-10 LAB — COMPREHENSIVE METABOLIC PANEL WITH GFR
ALT: 17 IU/L (ref 0–32)
AST: 42 IU/L — ABNORMAL HIGH (ref 0–40)
Albumin: 4.4 g/dL (ref 3.8–4.9)
Alkaline Phosphatase: 120 IU/L (ref 44–121)
BUN/Creatinine Ratio: 20 (ref 9–23)
BUN: 18 mg/dL (ref 6–24)
Bilirubin Total: 0.9 mg/dL (ref 0.0–1.2)
CO2: 25 mmol/L (ref 20–29)
Calcium: 9.6 mg/dL (ref 8.7–10.2)
Chloride: 102 mmol/L (ref 96–106)
Creatinine, Ser: 0.88 mg/dL (ref 0.57–1.00)
Globulin, Total: 2.4 g/dL (ref 1.5–4.5)
Glucose: 77 mg/dL (ref 70–99)
Potassium: 4.5 mmol/L (ref 3.5–5.2)
Sodium: 141 mmol/L (ref 134–144)
Total Protein: 6.8 g/dL (ref 6.0–8.5)
eGFR: 80 mL/min/{1.73_m2} (ref >59)

## 2023-12-10 LAB — LIPID PANEL
Chol/HDL Ratio: 3.2 ratio (ref 0.0–4.4)
Cholesterol, Total: 218 mg/dL — ABNORMAL HIGH (ref 100–199)
HDL: 69 mg/dL (ref >39)
LDL Chol Calc (NIH): 136 mg/dL — ABNORMAL HIGH (ref 0–99)
Triglycerides: 73 mg/dL (ref 0–149)
VLDL Cholesterol Cal: 13 mg/dL (ref 5–40)

## 2023-12-10 LAB — HEPATITIS C ANTIBODY: Hep C Virus Ab: NONREACTIVE

## 2023-12-10 LAB — HIV ANTIBODY (ROUTINE TESTING W REFLEX): HIV Screen 4th Generation wRfx: NONREACTIVE

## 2023-12-10 MED ORDER — AMPHETAMINE-DEXTROAMPHETAMINE 5 MG PO TABS: 5.0000 mg | ORAL_TABLET | Freq: Two times a day (BID) | ORAL | 0 refills | Status: DC | PRN

## 2023-12-10 MED ORDER — AMPHETAMINE-DEXTROAMPHETAMINE 5 MG PO TABS
5.0000 mg | ORAL_TABLET | Freq: Two times a day (BID) | ORAL | 0 refills | Status: DC | PRN
Start: 2024-02-08 — End: 2024-03-13

## 2023-12-10 NOTE — Assessment & Plan Note (Signed)
 No issues or concerns.  Continue Adderall 5 mg twice daily as needed.  Follow-up 3 months.

## 2023-12-10 NOTE — Progress Notes (Unsigned)
   Established Patient Office Visit  Subjective   Patient ID: Jaime Richardson, female    DOB: [birth year]  Age: 53 y.o. 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   No results found for any visits on 12/10/23.      Assessment & Plan:   Hyperlipidemia LDL goal <100 Assessment & Plan: Mildly elevated.  Overall ASCVD risk score low.  Continue with dietary lifestyle modifications.  No medication indicated at this time.   Attention deficit disorder (ADD) in adult Assessment & Plan: No issues or concerns.  Continue Adderall 5 mg twice daily as needed.  Follow-up 3 months.  Orders: -     Amphetamine -Dextroamphetamine ; Take 1 tablet (5 mg total) by mouth 2 (two) times daily as needed.  Dispense: 60 tablet; Refill: 0 -     Amphetamine -Dextroamphetamine ; Take 1 tablet (5 mg total) by mouth 2 (two) times daily as needed.  Dispense: 60 tablet; Refill: 0 -     Amphetamine -Dextroamphetamine ; Take 1 tablet (5 mg total) by mouth 2 (two) times daily as needed.  Dispense: 60 tablet; Refill: 0  Elevated AST (SGOT) Assessment & Plan: Mildly  elevated at 42.  ALT normal.  Recheck prior to next visit.  If still elevated consider further workup.      Return in about 3 months (around 03/10/2024) for ADHD.    Laneta Pintos, MD

## 2023-12-10 NOTE — Assessment & Plan Note (Signed)
 Mildly elevated at 42.  ALT normal.  Recheck prior to next visit.  If still elevated consider further workup.

## 2023-12-10 NOTE — Patient Instructions (Signed)
 It was nice to see you today,  We addressed the following topics today: -I sent your prescriptions in for the next 3 months to [facility] - In 3 months you can follow-up with me in person or virtually.  I would like you to get your labs checked a week before next visit to make sure your AST level has improved.  Have a great day,  Etha Henle, MD

## 2023-12-10 NOTE — Assessment & Plan Note (Signed)
 Mildly elevated.  Overall ASCVD risk score low.  Continue with dietary lifestyle modifications.  No medication indicated at this time.

## 2023-12-17 ENCOUNTER — Ambulatory Visit: Payer: 59 | Admitting: Family Medicine

## 2024-03-03 ENCOUNTER — Other Ambulatory Visit

## 2024-03-03 DIAGNOSIS — R7401 Elevation of levels of liver transaminase levels: Secondary | ICD-10-CM

## 2024-03-05 ENCOUNTER — Ambulatory Visit: Payer: Self-pay | Admitting: Family Medicine

## 2024-03-05 LAB — COMPREHENSIVE METABOLIC PANEL WITH GFR
ALT: 16 IU/L (ref 0–32)
AST: 39 IU/L (ref 0–40)
Albumin: 4.5 g/dL (ref 3.8–4.9)
Alkaline Phosphatase: 118 IU/L (ref 44–121)
BUN/Creatinine Ratio: 25 — ABNORMAL HIGH (ref 9–23)
BUN: 23 mg/dL (ref 6–24)
Bilirubin Total: 1 mg/dL (ref 0.0–1.2)
CO2: 20 mmol/L (ref 20–29)
Calcium: 9.7 mg/dL (ref 8.7–10.2)
Chloride: 102 mmol/L (ref 96–106)
Creatinine, Ser: 0.92 mg/dL (ref 0.57–1.00)
Globulin, Total: 2.3 g/dL (ref 1.5–4.5)
Glucose: 77 mg/dL (ref 70–99)
Potassium: 4.6 mmol/L (ref 3.5–5.2)
Sodium: 141 mmol/L (ref 134–144)
Total Protein: 6.8 g/dL (ref 6.0–8.5)
eGFR: 75 mL/min/1.73 (ref >59)

## 2024-03-10 ENCOUNTER — Encounter: Payer: Self-pay | Admitting: Family Medicine

## 2024-03-10 ENCOUNTER — Telehealth: Admitting: Family Medicine

## 2024-03-10 VITALS — Ht 64.57 in | Wt 153.0 lb

## 2024-03-10 DIAGNOSIS — R7401 Elevation of levels of liver transaminase levels: Secondary | ICD-10-CM | POA: Diagnosis not present

## 2024-03-10 DIAGNOSIS — E785 Hyperlipidemia, unspecified: Secondary | ICD-10-CM

## 2024-03-10 DIAGNOSIS — F988 Other specified behavioral and emotional disorders with onset usually occurring in childhood and adolescence: Secondary | ICD-10-CM | POA: Diagnosis not present

## 2024-03-10 NOTE — Assessment & Plan Note (Signed)
 AST improved, now in the normal range but still in the 30s.  Recheck with yearly physicals.  No further workup unless it worsens.

## 2024-03-10 NOTE — Progress Notes (Unsigned)
   Established Patient Office Visit  Subjective   Patient ID: EVOLETTE PENDELL, female    DOB: [birth year]  Age: 53 y.o. MRN: [id number]  Chief Complaint  Patient presents with   Medical Management of Chronic Issues    HPI Patient location: Home Provider location: [facility] primary care Duration of visit: 15 minutes Method of visit: Video visit attempted, but was unsuccessful.  Was able to see patient patient was not able to hear me.  Elevated AST-repeat labs were back within normal range.  Continue to monitor with yearly physicals  ADHD-patient taking her Adderall 5 mg.  No concerns or questions with this.  Does not take it twice a day every day, only as needed.    The ASCVD Risk score (Arnett DK, et al., 2019) failed to calculate for the following reasons:   The systolic blood pressure is missing  Health Maintenance Due  Topic Date Due   Hepatitis B Vaccines (1 of 3 - 19+ 3-dose series) Never done   Colonoscopy  Never done   MAMMOGRAM  09/13/2022   COVID-19 Vaccine (1 - 2024-25 season) Never done      Objective:     Ht 5' 4.57 (1.64 m)   Wt 153 lb (69.4 kg)   BMI 25.80 kg/m  {Vitals History (Optional):23777}  Physical Exam : General alert and oriented Pulmonary: Able to speak in full sentences without difficulty Psych: Pleasant   No results found for any visits on 03/10/24.      Assessment & Plan:   There are no diagnoses linked to this encounter.   No follow-ups on file.    Toribio MARLA Slain, MD

## 2024-03-10 NOTE — Assessment & Plan Note (Signed)
 Continue Adderall 5 mg twice daily as needed.  Next visit will be at 3 months, if no changes made at that time can switch to 62-month visits for ADHD medication follow-up.

## 2024-03-13 MED ORDER — AMPHETAMINE-DEXTROAMPHETAMINE 5 MG PO TABS
5.0000 mg | ORAL_TABLET | Freq: Two times a day (BID) | ORAL | 0 refills | Status: DC | PRN
Start: 2024-04-12 — End: 2024-06-02

## 2024-03-13 MED ORDER — AMPHETAMINE-DEXTROAMPHETAMINE 5 MG PO TABS
5.0000 mg | ORAL_TABLET | Freq: Two times a day (BID) | ORAL | 0 refills | Status: DC | PRN
Start: 2024-05-12 — End: 2024-06-02

## 2024-03-13 MED ORDER — AMPHETAMINE-DEXTROAMPHETAMINE 5 MG PO TABS: 5.0000 mg | ORAL_TABLET | Freq: Two times a day (BID) | ORAL | 0 refills | Status: DC | PRN

## 2024-06-02 ENCOUNTER — Ambulatory Visit (INDEPENDENT_AMBULATORY_CARE_PROVIDER_SITE_OTHER)

## 2024-06-02 VITALS — BP 123/80 | HR 74 | Temp 97.6°F | Ht 64.5 in | Wt 150.1 lb

## 2024-06-02 DIAGNOSIS — E559 Vitamin D deficiency, unspecified: Secondary | ICD-10-CM

## 2024-06-02 DIAGNOSIS — R7401 Elevation of levels of liver transaminase levels: Secondary | ICD-10-CM

## 2024-06-02 DIAGNOSIS — F988 Other specified behavioral and emotional disorders with onset usually occurring in childhood and adolescence: Secondary | ICD-10-CM

## 2024-06-02 DIAGNOSIS — E785 Hyperlipidemia, unspecified: Secondary | ICD-10-CM

## 2024-06-02 DIAGNOSIS — Z0000 Encounter for general adult medical examination without abnormal findings: Secondary | ICD-10-CM | POA: Insufficient documentation

## 2024-06-02 MED ORDER — AMPHETAMINE-DEXTROAMPHETAMINE 5 MG PO TABS
5.0000 mg | ORAL_TABLET | Freq: Two times a day (BID) | ORAL | 0 refills | Status: AC | PRN
Start: 2024-08-01 — End: ?

## 2024-06-02 MED ORDER — AMPHETAMINE-DEXTROAMPHETAMINE 5 MG PO TABS
5.0000 mg | ORAL_TABLET | Freq: Two times a day (BID) | ORAL | 0 refills | Status: AC | PRN
Start: 2024-06-02 — End: ?

## 2024-06-02 MED ORDER — AMPHETAMINE-DEXTROAMPHETAMINE 5 MG PO TABS: 5.0000 mg | ORAL_TABLET | Freq: Two times a day (BID) | ORAL | 0 refills | Status: AC | PRN

## 2024-06-02 NOTE — Assessment & Plan Note (Signed)
 Fluctuating liver function tests, possibly due to hepatic steatosis. Current tests stable. - Schedule fasting blood work to monitor liver function. - Consider liver ultrasound if liver function tests significantly elevate.

## 2024-06-02 NOTE — Progress Notes (Signed)
 Complete physical exam  Patient: Jaime Richardson   DOB: 10-Mar-1971   53 y.o. Female  MRN: [id number]  Subjective:    Chief Complaint  Patient presents with   Annual Exam    History of Present Illness   Jaime Richardson is a 53 year old female who presents for an annual physical exam.  Hyperlipidemia and liver function abnormalities - Mildly elevated cholesterol levels without significant changes over time - No history of hypertension or diabetes - Liver function tests have shown fluctuations in the past - No significant symptoms related to hyperlipidemia or liver function abnormalities - Due for fasting blood work to monitor cholesterol and liver function - Previous vitamin D  levels were normal  Cancer screening and preventive care - Due for mammogram and Pap smear; typically completed at [facility] - History of pacemaker, relevant for mammogram planning - Completed Cologuard test last year with normal results - Not due for another colon cancer screening for two more years  Fatigue - Experiences fatigue, attributed to aging and lack of exercise - Works a Psychologist, prison and probation services job, which she feels contributes to fatigue  Stimulant medication use - Currently taking Adderall 5 mg twice daily for the past couple of years - No side effects from Adderall use          Most recent fall risk assessment:    06/02/2024    3:46 PM  Fall Risk   Falls in the past year? 0  Follow up Falls evaluation completed     Most recent depression screenings:    06/02/2024    3:46 PM 03/10/2024    2:48 PM  PHQ 2/9 Scores  PHQ - 2 Score 0 0  PHQ- 9 Score  0    Vision:Within last year and Dental: No current dental problems and Receives regular dental care    Patient Care Team: Gayle Saddie JULIANNA DEVONNA as PCP - General (Physician Assistant) [place], [facility]   Outpatient Medications Prior to Visit  Medication Sig   [DISCONTINUED] amphetamine -dextroamphetamine   (ADDERALL) 5 MG tablet Take 1 tablet (5 mg total) by mouth 2 (two) times daily as needed.   [DISCONTINUED] amphetamine -dextroamphetamine  (ADDERALL) 5 MG tablet Take 1 tablet (5 mg total) by mouth 2 (two) times daily as needed.   [DISCONTINUED] amphetamine -dextroamphetamine  (ADDERALL) 5 MG tablet Take 1 tablet (5 mg total) by mouth 2 (two) times daily as needed.   No facility-administered medications prior to visit.    ROS   Per HPI    Objective:     BP 123/80   Pulse 74   Temp 97.6 F (36.4 C) (Oral)   Ht 5' 4.5 (1.638 m)   Wt 150 lb 1.9 oz (68.1 kg)   SpO2 98%   BMI 25.37 kg/m    Physical Exam Constitutional:      General: She is not in acute distress.    Appearance: Normal appearance.  Cardiovascular:     Rate and Rhythm: Normal rate and regular rhythm.     Heart sounds: Normal heart sounds. No murmur heard.    No friction rub. No gallop.  Pulmonary:     Effort: Pulmonary effort is normal. No respiratory distress.     Breath sounds: Normal breath sounds.  Abdominal:     General: Bowel sounds are normal.  Musculoskeletal:        General: No swelling.     Cervical back: Neck supple.  Lymphadenopathy:     Cervical: No cervical  adenopathy.  Skin:    General: Skin is warm and dry.  Neurological:     General: No focal deficit present.     Mental Status: She is alert.  Psychiatric:        Mood and Affect: Mood normal.        Behavior: Behavior normal.        Thought Content: Thought content normal.       No results found for any visits on 06/02/24. Last CBC Lab Results  Component Value Date   WBC 5.6 12/09/2023   HGB 14.1 12/09/2023   HCT 44.6 12/09/2023   MCV 88 12/09/2023   MCH 27.7 12/09/2023   RDW 12.4 12/09/2023   PLT 294 12/09/2023   Last metabolic panel Lab Results  Component Value Date   GLUCOSE 77 03/03/2024   NA 141 03/03/2024   K 4.6 03/03/2024   CL 102 03/03/2024   CO2 20 03/03/2024   BUN 23 03/03/2024   CREATININE 0.92 03/03/2024    EGFR 75 03/03/2024   CALCIUM 9.7 03/03/2024   PROT 6.8 03/03/2024   ALBUMIN 4.5 03/03/2024   LABGLOB 2.3 03/03/2024   AGRATIO 1.8 12/19/2021   BILITOT 1.0 03/03/2024   ALKPHOS 118 03/03/2024   AST 39 03/03/2024   ALT 16 03/03/2024   Last lipids Lab Results  Component Value Date   CHOL 218 (H) 12/09/2023   HDL 69 12/09/2023   LDLCALC 136 (H) 12/09/2023   TRIG 73 12/09/2023   CHOLHDL 3.2 12/09/2023   Last hemoglobin A1c Lab Results  Component Value Date   HGBA1C 5.6 05/14/2023   Last thyroid functions Lab Results  Component Value Date   TSH 1.240 05/14/2023   Last vitamin D  Lab Results  Component Value Date   VD25OH 90.8 12/09/2023        Assessment & Plan:    Routine Health Maintenance and Physical Exam  Health Maintenance  Topic Date Due   Hepatitis B Vaccine (1 of 3 - 19+ 3-dose series) Never done   Pneumococcal Vaccine for age over 37 (1 of 1 - PCV) Never done   Breast Cancer Screening  09/13/2022   COVID-19 Vaccine (1 - 2025-26 season) 06/18/2024*   Flu Shot  11/17/2024*   Colon Cancer Screening  06/02/2025*   Cologuard (Stool DNA test)  06/04/2026   DTaP/Tdap/Td vaccine (2 - Td or Tdap) 03/20/2033   Hepatitis C Screening  Completed   HIV Screening  Completed   Zoster (Shingles) Vaccine  Completed   HPV Vaccine  Aged Out   Meningitis B Vaccine  Aged Out  *Topic was postponed. The date shown is not the original due date.    Discussed health benefits of physical activity, and encouraged her to engage in regular exercise appropriate for her age and condition.  Hyperlipidemia LDL goal <100 -     TSH; Future -     Hemoglobin A1c; Future -     Lipid panel; Future -     CBC with Differential/Platelet; Future  Attention deficit disorder (ADD) in adult -     Amphetamine -Dextroamphetamine ; Take 1 tablet (5 mg total) by mouth 2 (two) times daily as needed.  Dispense: 60 tablet; Refill: 0 -     Amphetamine -Dextroamphetamine ; Take 1 tablet (5 mg total) by  mouth 2 (two) times daily as needed.  Dispense: 60 tablet; Refill: 0 -     Amphetamine -Dextroamphetamine ; Take 1 tablet (5 mg total) by mouth 2 (two) times daily as needed.  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 No side effects reported. - Continue Adderall 5 mg twice daily. - Send 90-day prescription refill to [facility] in [place]. - Schedule video visit follow-up in 90 days to assess medication efficacy and side effects.  Hypercholesterolemia Mildly elevated cholesterol, low risk profile. No hypertension or diabetes. - Schedule fasting blood work to monitor cholesterol levels. - Consider treatment if cholesterol levels significantly elevate.  Abnormal liver function tests Fluctuating liver function tests, possibly due to hepatic steatosis. Current tests stable. - Schedule fasting blood work to monitor liver function. - Consider liver ultrasound if liver function tests significantly elevate.  General Health Maintenance Routine health maintenance up to date except mammogram and Pap smear. Cologuard valid for three years. Declined flu shot. - Schedule mammogram and Pap smear with [facility]. - Ensure vaccinations are up to date, including tetanus. - Call [facility] to schedule mammogram and Pap smear before year-end.          Return in about 3 months (around 09/02/2024) for ADHD (video visit) .     Saddie JULIANNA Sacks, PA-C

## 2024-06-02 NOTE — Assessment & Plan Note (Signed)
 Mildly elevated. Overall ASCVD risk score low.  Rechecking lipid panel with labs. Continue with dietary lifestyle modifications. No medication indicated at this time.

## 2024-06-02 NOTE — Patient Instructions (Signed)
 VISIT SUMMARY: Today, you had your annual physical exam. We discussed your cholesterol levels, liver function, ADHD management, and preventive care, including cancer screenings.  YOUR PLAN: ATTENTION-DEFICIT HYPERACTIVITY DISORDER (ADHD): Your ADHD is being managed with Adderall, and you have not reported any side effects. -Continue taking Adderall 5 mg twice daily. -A 90-day prescription refill has been sent to [facility] in [place]. -Schedule a video visit follow-up in 90 days to assess the medication's efficacy and any side effects.  HYPERCHOLESTEROLEMIA: You have mildly elevated cholesterol levels but no other risk factors like hypertension or diabetes. -Schedule fasting blood work to monitor your cholesterol levels. -We will consider treatment if your cholesterol levels significantly elevate.  ABNORMAL LIVER FUNCTION TESTS: Your liver function tests have shown fluctuations, possibly due to fatty liver, but are currently stable. -Schedule fasting blood work to monitor your liver function. -We will consider a liver ultrasound if your liver function tests significantly elevate.  GENERAL HEALTH MAINTENANCE: You are due for a mammogram and Pap smear. Your Cologuard test from last year was normal, and you are not due for another colon cancer screening for two more years. -Schedule a mammogram and Pap smear with [facility] before the end of the year. -Ensure your vaccinations are up to date, including tetanus.  If you have any problems before your next visit feel free to message me via MyChart (minor issues or questions) or call the office, otherwise you may reach out to schedule an office visit.  Thank you! Saddie Sacks, PA-C

## 2024-06-02 NOTE — Assessment & Plan Note (Signed)
 ADHD managed with Adderall. No side effects reported. - Continue Adderall 5 mg twice daily. - Send 30 day refills to [facility]. - Schedule video visit follow-up in 90 days to assess medication efficacy and side effects.

## 2024-06-02 NOTE — Assessment & Plan Note (Signed)
 Routine health maintenance up to date except mammogram and Pap smear. Cologuard valid for three years. Declined flu shot. - Schedule mammogram and Pap smear with [facility]. - Ensure vaccinations are up to date, including tetanus. - Call [facility] to schedule mammogram and Pap smear before year-end.

## 2024-06-10 ENCOUNTER — Other Ambulatory Visit

## 2024-06-10 DIAGNOSIS — R7401 Elevation of levels of liver transaminase levels: Secondary | ICD-10-CM

## 2024-06-10 DIAGNOSIS — E785 Hyperlipidemia, unspecified: Secondary | ICD-10-CM

## 2024-06-10 DIAGNOSIS — E559 Vitamin D deficiency, unspecified: Secondary | ICD-10-CM

## 2024-07-08 ENCOUNTER — Other Ambulatory Visit

## 2024-07-08 DIAGNOSIS — E785 Hyperlipidemia, unspecified: Secondary | ICD-10-CM

## 2024-07-08 DIAGNOSIS — R7401 Elevation of levels of liver transaminase levels: Secondary | ICD-10-CM

## 2024-07-09 ENCOUNTER — Other Ambulatory Visit

## 2024-07-10 ENCOUNTER — Ambulatory Visit: Payer: Self-pay

## 2024-07-10 LAB — COMPREHENSIVE METABOLIC PANEL WITH GFR
ALT: 16 IU/L (ref 0–32)
AST: 33 IU/L (ref 0–40)
Albumin: 4.5 g/dL (ref 3.8–4.9)
Alkaline Phosphatase: 125 IU/L (ref 49–135)
BUN/Creatinine Ratio: 28 — ABNORMAL HIGH (ref 9–23)
BUN: 27 mg/dL — ABNORMAL HIGH (ref 6–24)
Bilirubin Total: 0.9 mg/dL (ref 0.0–1.2)
CO2: 26 mmol/L (ref 20–29)
Calcium: 9.4 mg/dL (ref 8.7–10.2)
Chloride: 99 mmol/L (ref 96–106)
Creatinine, Ser: 0.96 mg/dL (ref 0.57–1.00)
Globulin, Total: 2.1 g/dL (ref 1.5–4.5)
Glucose: 78 mg/dL (ref 70–99)
Potassium: 4.7 mmol/L (ref 3.5–5.2)
Sodium: 138 mmol/L (ref 134–144)
Total Protein: 6.6 g/dL (ref 6.0–8.5)
eGFR: 71 mL/min/1.73 (ref >59)

## 2024-07-10 LAB — LIPID PANEL
Chol/HDL Ratio: 3.2 ratio (ref 0.0–4.4)
Cholesterol, Total: 208 mg/dL — ABNORMAL HIGH (ref 100–199)
HDL: 66 mg/dL (ref >39)
LDL Chol Calc (NIH): 130 mg/dL — ABNORMAL HIGH (ref 0–99)
Triglycerides: 65 mg/dL (ref 0–149)
VLDL Cholesterol Cal: 12 mg/dL (ref 5–40)

## 2024-07-10 LAB — CBC WITH DIFFERENTIAL/PLATELET
Basophils Absolute: 0.1 x10E3/uL (ref 0.0–0.2)
Basos: 1 %
EOS (ABSOLUTE): 0.1 x10E3/uL (ref 0.0–0.4)
Eos: 2 %
Hematocrit: 45.1 % (ref 34.0–46.6)
Hemoglobin: 14.2 g/dL (ref 11.1–15.9)
Immature Grans (Abs): 0 x10E3/uL (ref 0.0–0.1)
Immature Granulocytes: 0 %
Lymphocytes Absolute: 1.6 x10E3/uL (ref 0.7–3.1)
Lymphs: 27 %
MCH: 28.1 pg (ref 26.6–33.0)
MCHC: 31.5 g/dL (ref 31.5–35.7)
MCV: 89 fL (ref 79–97)
Monocytes Absolute: 0.5 x10E3/uL (ref 0.1–0.9)
Monocytes: 9 %
Neutrophils Absolute: 3.6 x10E3/uL (ref 1.4–7.0)
Neutrophils: 61 %
Platelets: 307 x10E3/uL (ref 150–450)
RBC: 5.06 x10E6/uL (ref 3.77–5.28)
RDW: 12.5 % (ref 11.7–15.4)
WBC: 5.9 x10E3/uL (ref 3.4–10.8)

## 2024-07-10 LAB — HEMOGLOBIN A1C
Est. average glucose Bld gHb Est-mCnc: 111 mg/dL
Hgb A1c MFr Bld: 5.5 % (ref 4.8–5.6)

## 2024-07-10 LAB — VITAMIN D 25 HYDROXY (VIT D DEFICIENCY, FRACTURES): Vit D, 25-Hydroxy: 60.7 ng/mL (ref 30.0–100.0)

## 2024-07-10 LAB — TSH: TSH: 2.02 u[IU]/mL (ref 0.450–4.500)

## 2024-09-02 ENCOUNTER — Telehealth (INDEPENDENT_AMBULATORY_CARE_PROVIDER_SITE_OTHER)

## 2024-09-02 DIAGNOSIS — F988 Other specified behavioral and emotional disorders with onset usually occurring in childhood and adolescence: Secondary | ICD-10-CM | POA: Diagnosis not present

## 2024-09-02 DIAGNOSIS — Z79899 Other long term (current) drug therapy: Secondary | ICD-10-CM | POA: Diagnosis not present

## 2024-09-02 NOTE — Assessment & Plan Note (Signed)
 ADHD managed with Adderall. No side effects reported. - Continue Adderall 5 mg twice daily. - Send 30 day refills to [facility]. - PDMP reviewed, no aberrancies.  - Follow up again in 90 days

## 2024-09-02 NOTE — Progress Notes (Signed)
" ° °  Virtual Visit via Video Note  I connected with Jaime Richardson on 09/02/2024 at  3:50 PM EST by a video enabled telemedicine application and verified that I am speaking with the correct person using two identifiers.  Patient Location: Home Provider Location: Office/Clinic  I discussed the limitations, risks, security, and privacy concerns of performing an evaluation and management service by video and the availability of in person appointments. I also discussed with the patient that there may be a patient responsible charge related to this service. The patient expressed understanding and agreed to proceed.  Subjective: PCP: Gayle Saddie FALCON, PA-C  Chief Complaint  Patient presents with   Medication Management   HPI Jaime Richardson is a 54 y.o. female who presents via video visit for follow up ADHD. Patient is currently taking 5 mg of Adderall once daily and she reports symptoms are well controlled. Denies side effects and she is tolerating the medication well. Happy at the current dosage.   Also notes a wart on her thumb that she would like to have frozen off in clinic. She has tried some home remedies but reports that it keeps growing back and it is bothersome to her.   ROS: Per HPI Current Medications[1]  Observations/Objective: There were no vitals filed for this visit. Physical Exam Neurological:     General: No focal deficit present.     Mental Status: She is alert and oriented to person, place, and time.  Psychiatric:        Mood and Affect: Mood normal.        Behavior: Behavior normal.    PE was limited due to the media of this visit being via video  Assessment and Plan: Attention deficit disorder (ADD) in adult Assessment & Plan: ADHD managed with Adderall. No side effects reported. - Continue Adderall 5 mg twice daily. - Send 30 day refills to [facility]. - PDMP reviewed, no aberrancies.  - Follow up again in 90 days      Follow Up Instructions: No  follow-ups on file.   I discussed the assessment and treatment plan with the patient. The patient was provided an opportunity to ask questions, and all were answered. The patient agreed with the plan and demonstrated an understanding of the instructions.   The patient was advised to call back or seek an in-person evaluation if the symptoms worsen or if the condition fails to improve as anticipated.  The above assessment and management plan was discussed with the patient. The patient verbalized understanding of and has agreed to the management plan.   Saddie FALCON Gayle, PA-C     [1]  Current Outpatient Medications:    amphetamine -dextroamphetamine  (ADDERALL) 5 MG tablet, Take 1 tablet (5 mg total) by mouth 2 (two) times daily as needed., Disp: 60 tablet, Rfl: 0   amphetamine -dextroamphetamine  (ADDERALL) 5 MG tablet, Take 1 tablet (5 mg total) by mouth 2 (two) times daily as needed., Disp: 60 tablet, Rfl: 0   amphetamine -dextroamphetamine  (ADDERALL) 5 MG tablet, Take 1 tablet (5 mg total) by mouth 2 (two) times daily as needed., Disp: 60 tablet, Rfl: 0  "

## 2024-09-07 ENCOUNTER — Telehealth: Payer: Self-pay

## 2024-09-07 NOTE — Telephone Encounter (Signed)
 The wart should take on average 2-3 weeks to heal after being frozen

## 2024-09-07 NOTE — Telephone Encounter (Signed)
 Copied from CRM #[id number]. Topic: Clinical - Medical Advice >> Sep 07, 2024  9:10 AM Jaime Richardson wrote: Reason for CRM:  She has an appointment for 10/13/24 to remove a wart on her hand. She wants to know how long it takes for her hand to heal. Please call her at [phone] or send a message through MyChart. Thanks

## 2024-09-08 ENCOUNTER — Ambulatory Visit

## 2024-09-08 VITALS — BP 120/75 | HR 84 | Temp 98.3°F | Ht 64.5 in | Wt 153.0 lb

## 2024-09-08 DIAGNOSIS — B079 Viral wart, unspecified: Secondary | ICD-10-CM

## 2024-09-09 ENCOUNTER — Ambulatory Visit

## 2024-09-09 NOTE — Progress Notes (Signed)
 Patient came in for wart removal via cryotherapy. Was informed that office was currently out of liquid nitrogen. Office managed made aware of situation. Apologized to patient and explained that she would not be charged for anything today. Rescheduled for 2 weeks out to ensure delivery of liquid nitrogen.  If delivery happens sooner this week, will call patient and advised I can work her in any time to get it frozen off.  Patient verbalized understanding and was in agreement with the plan.   NO CHARGE FOR THIS VISIT

## 2024-09-21 ENCOUNTER — Ambulatory Visit

## 2024-10-06 ENCOUNTER — Ambulatory Visit

## 2024-10-13 ENCOUNTER — Ambulatory Visit
